# Patient Record
Sex: Male | Born: 1980 | Race: White | Hispanic: Yes | Marital: Married | Smoking: Former smoker
Health system: Southern US, Community
[De-identification: ages and names within clinical notes are randomized; demographics above are authoritative.]

## PROBLEM LIST (undated history)

## (undated) DIAGNOSIS — M25569 Pain in unspecified knee: Secondary | ICD-10-CM

## (undated) DIAGNOSIS — F329 Major depressive disorder, single episode, unspecified: Secondary | ICD-10-CM

## (undated) DIAGNOSIS — G8929 Other chronic pain: Secondary | ICD-10-CM

## (undated) DIAGNOSIS — F1910 Other psychoactive substance abuse, uncomplicated: Secondary | ICD-10-CM

## (undated) DIAGNOSIS — IMO0002 Reserved for concepts with insufficient information to code with codable children: Secondary | ICD-10-CM

## (undated) DIAGNOSIS — F419 Anxiety disorder, unspecified: Secondary | ICD-10-CM

## (undated) DIAGNOSIS — F32A Depression, unspecified: Secondary | ICD-10-CM

## (undated) DIAGNOSIS — G4730 Sleep apnea, unspecified: Secondary | ICD-10-CM

## (undated) HISTORY — DX: Anxiety disorder, unspecified: F41.9

## (undated) HISTORY — PX: CHOLECYSTECTOMY: SHX55

## (undated) HISTORY — PX: OTHER SURGICAL HISTORY: SHX169

## (undated) HISTORY — DX: Other psychoactive substance abuse, uncomplicated: F19.10

## (undated) HISTORY — PX: BACK SURGERY: SHX140

## (undated) HISTORY — DX: Depression, unspecified: F32.A

## (undated) HISTORY — DX: Sleep apnea, unspecified: G47.30

## (undated) HISTORY — PX: GALLBLADDER SURGERY: SHX652

---
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
## 2011-11-12 ENCOUNTER — Ambulatory Visit: Payer: Self-pay | Admitting: Orthopedic Surgery

## 2012-04-13 ENCOUNTER — Emergency Department (HOSPITAL_COMMUNITY)
Admission: EM | Admit: 2012-04-13 | Discharge: 2012-04-13 | Disposition: A | Discharge status: Home / Self Care | Payer: Worker's Compensation | Attending: Emergency Medicine | Admitting: Emergency Medicine

## 2012-04-13 ENCOUNTER — Encounter (HOSPITAL_COMMUNITY): Payer: Self-pay | Admitting: *Deleted

## 2012-04-13 DIAGNOSIS — Z8739 Personal history of other diseases of the musculoskeletal system and connective tissue: Secondary | ICD-10-CM | POA: Insufficient documentation

## 2012-04-13 DIAGNOSIS — M25569 Pain in unspecified knee: Secondary | ICD-10-CM | POA: Insufficient documentation

## 2012-04-13 DIAGNOSIS — R51 Headache: Secondary | ICD-10-CM | POA: Insufficient documentation

## 2012-04-13 DIAGNOSIS — S51809A Unspecified open wound of unspecified forearm, initial encounter: Secondary | ICD-10-CM | POA: Insufficient documentation

## 2012-04-13 DIAGNOSIS — M549 Dorsalgia, unspecified: Secondary | ICD-10-CM | POA: Insufficient documentation

## 2012-04-13 DIAGNOSIS — W503XXA Accidental bite by another person, initial encounter: Secondary | ICD-10-CM

## 2012-04-13 DIAGNOSIS — F17200 Nicotine dependence, unspecified, uncomplicated: Secondary | ICD-10-CM | POA: Insufficient documentation

## 2012-04-13 DIAGNOSIS — G8929 Other chronic pain: Secondary | ICD-10-CM | POA: Insufficient documentation

## 2012-04-13 DIAGNOSIS — S0990XA Unspecified injury of head, initial encounter: Secondary | ICD-10-CM | POA: Insufficient documentation

## 2012-04-13 HISTORY — DX: Pain in unspecified knee: M25.569

## 2012-04-13 HISTORY — DX: Reserved for concepts with insufficient information to code with codable children: IMO0002

## 2012-04-13 HISTORY — DX: Other chronic pain: G89.29

## 2012-04-13 MED ORDER — NAPROXEN 500 MG PO TABS: 500.0000 mg | ORAL_TABLET | Freq: Two times a day (BID) | ORAL | Status: DC

## 2012-04-13 MED ORDER — PREDNISONE 20 MG PO TABS: 40.0000 mg | ORAL_TABLET | Freq: Every day | ORAL | Status: DC

## 2012-04-13 MED ORDER — NAPROXEN 250 MG PO TABS
500.0000 mg | ORAL_TABLET | Freq: Once | ORAL | Status: AC
  Administered 2012-04-13: 500 mg via ORAL
  Filled 2012-04-13: qty 2

## 2012-04-13 MED ORDER — AMOXICILLIN-POT CLAVULANATE 875-125 MG PO TABS: 1.0000 | ORAL_TABLET | Freq: Two times a day (BID) | ORAL | Status: DC

## 2012-04-13 NOTE — ED Notes (Signed)
Pt abrasions cleaning and dressing applied.
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
## 2012-04-13 NOTE — ED Notes (Signed)
Pt having workers comp. Being performed.

## 2013-02-01 ENCOUNTER — Emergency Department: Payer: Self-pay | Admitting: Emergency Medicine

## 2013-02-01 LAB — COMPREHENSIVE METABOLIC PANEL
Albumin: 4.2 g/dL (ref 3.4–5.0)
Alkaline Phosphatase: 84 U/L (ref 50–136)
Anion Gap: 6 — ABNORMAL LOW (ref 7–16)
BUN: 12 mg/dL (ref 7–18)
Bilirubin,Total: 0.4 mg/dL (ref 0.2–1.0)
Co2: 27 mmol/L (ref 21–32)
EGFR (African American): >60
EGFR (Non-African Amer.): >60
Glucose: 119 mg/dL — ABNORMAL HIGH (ref 65–99)
Osmolality: 278 (ref 275–301)
Potassium: 4 mmol/L (ref 3.5–5.1)
Total Protein: 8.8 g/dL — ABNORMAL HIGH (ref 6.4–8.2)

## 2013-02-01 LAB — URINALYSIS, COMPLETE
Bacteria: NONE SEEN
Bilirubin,UR: NEGATIVE
Blood: NEGATIVE
Glucose,UR: NEGATIVE mg/dL (ref 0–75)
Specific Gravity: 1.023 (ref 1.003–1.030)
Squamous Epithelial: NONE SEEN

## 2013-02-01 LAB — CBC WITH DIFFERENTIAL/PLATELET
Basophil #: 0 10*3/uL (ref 0.0–0.1)
Basophil %: 0.4 %
Eosinophil %: 0.6 %
HGB: 15.9 g/dL (ref 13.0–18.0)
Monocyte %: 6.4 %
Neutrophil %: 62.6 %

## 2014-02-21 ENCOUNTER — Emergency Department: Payer: Self-pay | Admitting: Emergency Medicine

## 2014-06-26 ENCOUNTER — Inpatient Hospital Stay: Payer: Self-pay | Admitting: Surgery

## 2014-06-26 LAB — COMPREHENSIVE METABOLIC PANEL
ALBUMIN: 4.1 g/dL (ref 3.4–5.0)
ALK PHOS: 87 U/L
ALT: 107 U/L — AB
AST: 49 U/L — AB (ref 15–37)
Anion Gap: 5 — ABNORMAL LOW (ref 7–16)
BILIRUBIN TOTAL: 0.6 mg/dL (ref 0.2–1.0)
BUN: 14 mg/dL (ref 7–18)
CALCIUM: 9.3 mg/dL (ref 8.5–10.1)
CO2: 27 mmol/L (ref 21–32)
Chloride: 106 mmol/L (ref 98–107)
Creatinine: 0.97 mg/dL (ref 0.60–1.30)
EGFR (African American): >60
EGFR (Non-African Amer.): >60
GLUCOSE: 119 mg/dL — AB (ref 65–99)
Osmolality: 277 (ref 275–301)
POTASSIUM: 4.1 mmol/L (ref 3.5–5.1)
Sodium: 138 mmol/L (ref 136–145)
TOTAL PROTEIN: 8.6 g/dL — AB (ref 6.4–8.2)

## 2014-06-26 LAB — CBC WITH DIFFERENTIAL/PLATELET
Basophil #: 0 10*3/uL (ref 0.0–0.1)
Basophil %: 0.2 %
Eosinophil #: 0 10*3/uL (ref 0.0–0.7)
Eosinophil %: 0.3 %
HCT: 48.3 % (ref 40.0–52.0)
HGB: 16 g/dL (ref 13.0–18.0)
Lymphocyte #: 1.9 10*3/uL (ref 1.0–3.6)
Lymphocyte %: 10.4 %
MCH: 31.2 pg (ref 26.0–34.0)
MCHC: 33.1 g/dL (ref 32.0–36.0)
MCV: 95 fL (ref 80–100)
MONOS PCT: 4.9 %
Monocyte #: 0.9 x10 3/mm (ref 0.2–1.0)
Neutrophil #: 15.1 10*3/uL — ABNORMAL HIGH (ref 1.4–6.5)
Neutrophil %: 84.2 %
Platelet: 285 10*3/uL (ref 150–440)
RBC: 5.11 10*6/uL (ref 4.40–5.90)
RDW: 13.1 % (ref 11.5–14.5)
WBC: 17.9 10*3/uL — AB (ref 3.8–10.6)

## 2014-06-26 LAB — URINALYSIS, COMPLETE
BACTERIA: NONE SEEN
BILIRUBIN, UR: NEGATIVE
BLOOD: NEGATIVE
GLUCOSE, UR: NEGATIVE mg/dL (ref 0–75)
KETONE: NEGATIVE
Leukocyte Esterase: NEGATIVE
Nitrite: NEGATIVE
Ph: 6 (ref 4.5–8.0)
Protein: NEGATIVE
RBC,UR: 1 /HPF (ref 0–5)
SPECIFIC GRAVITY: 1.021 (ref 1.003–1.030)

## 2014-06-26 LAB — LIPASE, BLOOD: LIPASE: 320 U/L (ref 73–393)

## 2014-06-27 LAB — COMPREHENSIVE METABOLIC PANEL
ALBUMIN: 3.3 g/dL — AB (ref 3.4–5.0)
ALK PHOS: 73 U/L
ALT: 101 U/L — AB
ANION GAP: 2 — AB (ref 7–16)
BUN: 8 mg/dL (ref 7–18)
Bilirubin,Total: 1 mg/dL (ref 0.2–1.0)
CREATININE: 0.87 mg/dL (ref 0.60–1.30)
Calcium, Total: 8.3 mg/dL — ABNORMAL LOW (ref 8.5–10.1)
Chloride: 106 mmol/L (ref 98–107)
Co2: 29 mmol/L (ref 21–32)
EGFR (African American): >60
Glucose: 87 mg/dL (ref 65–99)
Osmolality: 272 (ref 275–301)
Potassium: 4.1 mmol/L (ref 3.5–5.1)
SGOT(AST): 52 U/L — ABNORMAL HIGH (ref 15–37)
Sodium: 137 mmol/L (ref 136–145)
Total Protein: 7.2 g/dL (ref 6.4–8.2)

## 2014-06-27 LAB — CBC WITH DIFFERENTIAL/PLATELET
BASOS PCT: 0.4 %
Basophil #: 0 10*3/uL (ref 0.0–0.1)
EOS ABS: 0.2 10*3/uL (ref 0.0–0.7)
EOS PCT: 2.2 %
HCT: 47 % (ref 40.0–52.0)
HGB: 15.8 g/dL (ref 13.0–18.0)
LYMPHS PCT: 37.5 %
Lymphocyte #: 3 10*3/uL (ref 1.0–3.6)
MCH: 32 pg (ref 26.0–34.0)
MCHC: 33.7 g/dL (ref 32.0–36.0)
MCV: 95 fL (ref 80–100)
MONOS PCT: 10 %
Monocyte #: 0.8 x10 3/mm (ref 0.2–1.0)
NEUTROS PCT: 49.9 %
Neutrophil #: 4 10*3/uL (ref 1.4–6.5)
Platelet: 205 10*3/uL (ref 150–440)
RBC: 4.94 10*6/uL (ref 4.40–5.90)
RDW: 12.7 % (ref 11.5–14.5)
WBC: 8 10*3/uL (ref 3.8–10.6)

## 2014-06-27 LAB — PROTIME-INR
INR: 1.1
PROTHROMBIN TIME: 14.3 s (ref 11.5–14.7)

## 2014-06-27 LAB — AMYLASE: AMYLASE: 66 U/L (ref 25–115)

## 2014-06-27 LAB — LIPASE, BLOOD: Lipase: 385 U/L (ref 73–393)

## 2014-06-27 LAB — APTT: Activated PTT: 35.6 secs (ref 23.6–35.9)

## 2014-09-28 NOTE — Consult Note (Signed)
PATIENT NAME:  Nicholas Haney, Nicholas Haney MR#:  [id number] DATE OF BIRTH:  12/07/1980  DATE OF CONSULTATION:  02/01/2013  CONSULTING PHYSICIAN:  Cristal Deerhristopher A. Nathasha Fiorillo, MD  REASON FOR CONSULTATION: Nausea, vomiting and epigastric pain.   HISTORY OF PRESENT ILLNESS: Nicholas Haney is a pleasant 34 year old male with history of back surgery and 3-times-a-week alcohol use who presents with recurrent back pain and one day of epigastric pain which has now resolved. He says that yesterday in the afternoon, his back started hurting again. This often happens and improves with massage; however, late last night, he developed epigastric pain, nausea, vomiting, and he presented to the ED today.   Since he was in the ED, he was given morphine and his pain has since subsided. He is currently without pain. He has no fevers, chills, night sweats, shortness of breath, cough, chest pain. No dysuria or hematuria.   PAST MEDICAL HISTORY: History of back surgery, 2005.   HOME MEDICATIONS: None.   ALLERGIES: None.   SOCIAL HISTORY: Occasional tobacco use, 3-times-a-week up to 6-pack alcohol use.  Denies other drug use. History of a soldier.  FAMILY HISTORY: No diabetes, cancer or heart disease. Mother did have gallbladder removal.   REVIEW OF SYSTEMS: A 12-point review of systems was obtained. Pertinent positives and negatives as above.   PHYSICAL EXAMINATION:  VITAL SIGNS: Temperature 98.2, pulse 78, blood pressure 136/60, respirations 20, 96% on room air.  GENERAL: No acute distress, alert and oriented x 3.  HEAD: Normocephalic, atraumatic.  EYES: No scleral icterus. No conjunctivitis.  FACE: No obvious facial trauma. Normal external nose. Normal external ears.  CHEST: Lungs clear to auscultation, moving air well.  HEART: Regular rate and rhythm. No murmurs, rubs or gallops.  ABDOMEN: Soft, nontender, nondistended.  NEUROLOGIC: Cranial nerves II through XII grossly intact. Sensation intact in all 4   extremities.  EXTREMITIES: Moves all extremities well. Strength 5/5.   DIAGNOSTIC STUDIES: Labs: White blood cell count 8.3. Hemoglobin, hematocrit, and platelets are normal. AST and ALT are 52 and 92. Lipase is 677.   Ultrasound shows mild gallbladder wall thickening and what looks like cholestatic gallbladder neck.   ASSESSMENT AND PLAN: [name] is a pleasant 34 year old with what sounds like pancreatitis, may be due to alcohol, but also with signs concerning for hydrops with an obstructive stone, and I have offered cholecystectomy as early as Friday, and he would like to continue to think about this.   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 He would prefer to follow up with me in clinic and we will discuss at that time.    ____________________________ Si Raiderhristopher A. Macie Baum, MD cal:np D: 02/01/2013 15:04:00 ET T: 02/01/2013 15:56:00 ET JOB#: [id number]  cc: Cristal Deerhristopher A. Marlean Mortell, MD, <Dictator> Jarvis NewcomerHRISTOPHER A Yissel Habermehl MD ELECTRONICALLY SIGNED 02/03/2013 9:25

## 2014-10-01 LAB — SURGICAL PATHOLOGY

## 2014-10-07 NOTE — H&P (Signed)
PATIENT NAME:  Nicholas Haney, Nicholas Haney MR#:  [id number] DATE OF BIRTH:  13-Dec-1980  DATE OF ADMISSION:  06/26/2014  HISTORY OF PRESENT ILLNESS: [name] is a 34 year old Hispanic male who for a number of years has had occasional postprandial epigastric pain and vomiting. This has been occurring about 1 to 2 times a month for the last couple of years. He recently started Zoloft, as a medication for his posttraumatic stress disorder, through the [facility], and since he started that he has noticed that he feels like his food gets stuck in his lower neck, upper chest region and he has smaller, softer bowel movements. He has been under the care of the [facility] for this for a number of years, but this was the first medication that he has agreed to take as according to his wife he has been noncompliant with previous suggestions.   Last night about 8:00, right after eating a meal, he drank lots of water because again he felt like his food was stuck and he then developed epigastric pain. He did not vomit at that time but had some dry heaves and heartburn and denies fever and any jaundice symptoms, but he said he has been feeling chills. The pain was so severe that he was unable to sleep all night and he sought medical attention in the Emergency Department this morning.   PAST MEDICAL HISTORY: Back surgery 2005, knee problems, posttraumatic stress disorder, obesity.   MEDICATIONS: Endocet 5/325 q. 6 hours p.r.n., naproxen 500 mg b.i.d., Valium 5 mg q.i.d. p.r.n., Zoloft, unknown dose.   ALLERGIES: None.   FAMILY HISTORY: There is no family history of diabetes or cancer. The patient's mother did have her gallbladder removed.   REVIEW OF SYSTEMS: A complete 10 system review is negative, particularly for head, eyes, ears, cardiac, respiratory, and neurologic systems. Pertinent positives include the gastrointestinal symptoms as mentioned in the history of present illness and pertinent  gastrointestinal negatives include denial of change in the color of his eyes, skin, urine, and stool. The patient is very quiet about his psychiatric symptoms, but it is clear that there is some concerns psychologically that extend beyond his current physical illness. The patient denies any genitourinary symptoms as well.   SOCIAL HISTORY: The patient is married and has 2 daughters living at home, ages 437 and 723. He works as a Biochemist, clinicaldetention officer. He smokes 3 to 4 cigarettes per day and does not wish to have a nicotine patch while hospitalized. He drinks 6 to 12 beers three to four days per week and does not use any other types of alcohol.   PHYSICAL EXAMINATION: GENERAL: A large muscular young man who is quite stoic and does not appear to be in any distress.  VITAL SIGNS: Height 5 feet 11 inches, weight 275 pounds, BMI 38.4. Temperature 98.1, pulse 82, respirations 18, blood pressure 157/80, oxygen saturation 97% on room air.  HEENT: Pupils equally round and reactive to light. Extraocular movements intact. Sclerae anicteric. Oropharynx clear. Mucous membranes moist. Hearing intact to voice.  NECK: Supple with no thyroid enlargement, jugular venous distention or tracheal deviation.  HEART: Regular rate and rhythm with no murmurs or rubs.  LUNGS: Clear to auscultation with normal respiratory effort bilaterally.  ABDOMEN: Soft, nontender, nondistended, with normal bowel sounds.  EXTREMITIES: No edema with normal capillary refill bilaterally.  NEUROLOGIC: Cranial nerves II through XII, motor and sensation grossly intact.  PSYCHIATRIC: The patient is quite stoic but does answer direct  questions and his wife offers additional information regarding his multiple stressors.   DIAGNOSTIC DATA: White blood cell count 18,000, normal hemoglobin and hematocrit, platelet count and machine differential. Electrolytes normal. Lipase 320. Bilirubin 0.6, alkaline phosphatase 87, SGOT 49, SGPT 107.   Gallbladder  ultrasound reveals a 23 mm gallstone stuck in the neck of his gallbladder, just as it was on previous ultrasound in 2014 when he was seen by my partner and declined follow-up. There is no gallbladder wall thickening and no pericholecystic edema or fluid. The common bile duct measures 4 mm and there is prominent liver echogenicity consistent with hepatic steatosis.   ASSESSMENT: Possible acute cholecystitis but also possible alcoholic hepatitis, possibly exacerbated by concomitant recent Zoloft use. I favor the former and advised the patient to be admitted to the hospital, which he agreed upon.   PLAN: Admit to the hospital, IV fluid hydration, antiemetics, IV analgesics, IV antibiotics, repeat laboratory studies in the morning to include another amylase, and laparoscopic cholecystectomy tomorrow. The patient and his wife understand the 1:200 risk of common bile duct injury and also understand that if that occurs that the implications are serious and will likely require further surgery. The patient and his wife also understand that he is to not strain his abdominal wall for 6 weeks following surgery in order to prevent incisional herniation at the large incision. They also understand the small risk of having to convert his operation from laparoscopic to open. The patient is in complete agreement with the plan and wishes to proceed.  ____________________________ Claude Manges, MD wfm:sb D: 06/26/2014 09:09:32 ET T: 06/26/2014 09:19:00 ET JOB#: [id number]  cc: Claude Manges, MD, <Dictator> Claude Manges MD ELECTRONICALLY SIGNED 06/29/2014 13:53

## 2014-10-07 NOTE — Discharge Summary (Signed)
PATIENT NAME:  Nicholas Haney, Nicholas Haney MR#:  [id number] DATE OF BIRTH:  05-10-1981  DATE OF ADMISSION:  06/26/2014 DATE OF DISCHARGE:  06/28/2014  PRINCIPAL DIAGNOSIS: Acute cholecystitis.   OTHER DIAGNOSES: Posttraumatic stress disorder, obesity.   PRINCIPAL PROCEDURE PERFORMED DURING THIS ADMISSION: Laparoscopic cholecystectomy January 20.   HOSPITAL COURSE: The patient was admitted to the hospital, underwent the above-mentioned procedure for the above-mentioned diagnosis and improved dramatically and was discharged home the following day. He was asked to make an appointment to see me and to call the office in the interim for any problems.    ____________________________ Claude MangesWilliam F. Bailie Christenbury, MD wfm:TT D: 07/11/2014 19:35:09 ET T: 07/11/2014 21:11:51 ET JOB#: [id number]  cc: Claude MangesWilliam F. Tyde Lamison, MD, <Dictator> Claude MangesWILLIAM F Brydon Spahr MD ELECTRONICALLY SIGNED 07/12/2014 20:55

## 2014-10-07 NOTE — Op Note (Signed)
PATIENT NAME:  Nicholas LenzLMA PEREZ, Ronin MR#:  [id number] DATE OF BIRTH:  [birth year]  DATE OF PROCEDURE:  06/27/2014  OPERATION PERFORMED: Laparoscopic cholecystectomy.   PREOPERATIVE DIAGNOSIS:  Acute cholecystitis.      POSTOPERATIVE DIAGNOSIS:  Acute cholecystitis.  SURGEON: Claude MangesWilliam F Chrissa Meetze, M.D.   ANESTHESIA: General.   PROCEDURE IN DETAIL: The patient was placed supine on the Operating Room table and prepped and draped in the usual sterile fashion. A 15 mmHg CO2 pneumoperitoneum was created via a Veress needle on the infraumbilical umbilical position and this was replaced with a 5 mm trocar and a 30 degrees angled laparoscope. Remaining trocars were placed under direct visualization. The fundus of the gallbladder was retracted superiorly and ventrally, and adhesions to the infundibulum of the gallbladder were taken down bluntly with the aid of the electrocautery, and the infundibulum was retracted laterally to open up the triangle of Calot. There was a very large Cloquet's node that was present within the triangle and this was dissected off towards the porta hepatis. The cystic artery was identified and doubly clipped and divided. The cystic duct was identified, doubly clipped and divided and the gallbladder was removed from the liver bed with the electrocautery. It was placed in an Endo Catch bag and extracted from the abdomen via the epigastric port. This port site fascia required significant enlargement due to the size of the stone.  It was then closed with running 0 Vicryl suture that was placed with the laparoscopic puncture closure device. The right upper quadrant was irrigated with warm normal saline. This was suctioned clear. Hemostasis was excellent and the clips were secure and there was no evidence of bile staining. Therefore, the peritoneum was desufflated and decannulated and all four skin sites were closed with subcuticular 5-0 Monocryl and suture strips. The patient's gallbladder was  opened as he requested to have his gallstone and he just had a single large gallstone that was 33 x 15 mm and ovoid in shape.     ____________________________ Claude MangesWilliam F. Keltin Baird, MD wfm:at D: 06/27/2014 15:04:32 ET T: 06/27/2014 15:13:06 ET JOB#: [id number]  cc: Claude MangesWilliam F. Emilee Market, MD, <Dictator> Claude MangesWILLIAM F Andree Golphin MD ELECTRONICALLY SIGNED 06/29/2014 13:53

## 2016-01-13 ENCOUNTER — Encounter (HOSPITAL_COMMUNITY): Payer: Self-pay

## 2016-01-13 DIAGNOSIS — F17210 Nicotine dependence, cigarettes, uncomplicated: Secondary | ICD-10-CM | POA: Insufficient documentation

## 2016-01-13 DIAGNOSIS — M545 Low back pain: Secondary | ICD-10-CM | POA: Diagnosis present

## 2016-01-13 DIAGNOSIS — M5441 Lumbago with sciatica, right side: Secondary | ICD-10-CM | POA: Insufficient documentation

## 2016-01-13 NOTE — ED Triage Notes (Signed)
Pt states sent here for high BP. Pt states has HA, some light headedness.

## 2016-01-14 ENCOUNTER — Encounter (HOSPITAL_COMMUNITY): Payer: Self-pay | Admitting: Radiology

## 2016-01-14 ENCOUNTER — Emergency Department (HOSPITAL_COMMUNITY): Payer: Worker's Compensation

## 2016-01-14 ENCOUNTER — Emergency Department (HOSPITAL_COMMUNITY)
Admission: EM | Admit: 2016-01-14 | Discharge: 2016-01-14 | Disposition: A | Discharge status: Home / Self Care | Payer: Worker's Compensation | Attending: Emergency Medicine | Admitting: Emergency Medicine

## 2016-01-14 DIAGNOSIS — M5431 Sciatica, right side: Secondary | ICD-10-CM

## 2016-01-14 MED ORDER — KETOROLAC TROMETHAMINE 60 MG/2ML IM SOLN
60.0000 mg | Freq: Once | INTRAMUSCULAR | Status: AC
  Administered 2016-01-14: 60 mg via INTRAMUSCULAR
  Filled 2016-01-14: qty 2

## 2016-01-14 MED ORDER — HYDROCODONE-ACETAMINOPHEN 5-325 MG PO TABS
2.0000 | ORAL_TABLET | Freq: Once | ORAL | Status: AC
  Administered 2016-01-14: 2 via ORAL
  Filled 2016-01-14: qty 2

## 2016-01-14 MED ORDER — OXYCODONE HCL 5 MG PO TABS: 5.0000 mg | ORAL_TABLET | Freq: Two times a day (BID) | ORAL | 0 refills | Status: DC | PRN
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
## 2016-01-14 NOTE — ED Notes (Signed)
Patient transported to CT 

## 2018-12-07 DIAGNOSIS — F1020 Alcohol dependence, uncomplicated: Secondary | ICD-10-CM | POA: Insufficient documentation

## 2018-12-07 DIAGNOSIS — F1091 Alcohol use, unspecified, in remission: Secondary | ICD-10-CM | POA: Insufficient documentation

## 2018-12-15 ENCOUNTER — Other Ambulatory Visit: Payer: Self-pay | Admitting: *Deleted

## 2018-12-15 DIAGNOSIS — Z20822 Contact with and (suspected) exposure to covid-19: Secondary | ICD-10-CM

## 2018-12-20 LAB — NOVEL CORONAVIRUS, NAA: SARS-CoV-2, NAA: DETECTED — AB

## 2019-01-23 ENCOUNTER — Other Ambulatory Visit: Payer: Self-pay

## 2019-01-23 ENCOUNTER — Ambulatory Visit (INDEPENDENT_AMBULATORY_CARE_PROVIDER_SITE_OTHER): Payer: 59 | Admitting: Family Medicine

## 2019-01-23 ENCOUNTER — Encounter: Payer: Self-pay | Admitting: Family Medicine

## 2019-01-23 DIAGNOSIS — F332 Major depressive disorder, recurrent severe without psychotic features: Secondary | ICD-10-CM

## 2019-01-23 DIAGNOSIS — F419 Anxiety disorder, unspecified: Secondary | ICD-10-CM | POA: Diagnosis not present

## 2019-01-23 DIAGNOSIS — F329 Major depressive disorder, single episode, unspecified: Secondary | ICD-10-CM | POA: Insufficient documentation

## 2019-01-23 DIAGNOSIS — Z8616 Personal history of COVID-19: Secondary | ICD-10-CM

## 2019-01-23 DIAGNOSIS — Z720 Tobacco use: Secondary | ICD-10-CM

## 2019-01-23 DIAGNOSIS — F32A Depression, unspecified: Secondary | ICD-10-CM | POA: Insufficient documentation

## 2019-01-23 DIAGNOSIS — Z87891 Personal history of nicotine dependence: Secondary | ICD-10-CM | POA: Insufficient documentation

## 2019-01-23 DIAGNOSIS — F4310 Post-traumatic stress disorder, unspecified: Secondary | ICD-10-CM | POA: Diagnosis not present

## 2019-01-23 DIAGNOSIS — Z8619 Personal history of other infectious and parasitic diseases: Secondary | ICD-10-CM

## 2019-01-23 DIAGNOSIS — F1020 Alcohol dependence, uncomplicated: Secondary | ICD-10-CM | POA: Diagnosis not present

## 2019-01-23 DIAGNOSIS — Z7689 Persons encountering health services in other specified circumstances: Secondary | ICD-10-CM

## 2019-01-23 MED ORDER — VARENICLINE TARTRATE 1 MG PO TABS: ORAL_TABLET | ORAL | 1 refills | Status: DC

## 2019-01-23 MED ORDER — BUPROPION HCL ER (XL) 150 MG PO TB24: ORAL_TABLET | ORAL | 1 refills | Status: DC

## 2019-01-23 MED ORDER — NALTREXONE HCL 50 MG PO TABS: ORAL_TABLET | ORAL | 1 refills | Status: DC

## 2019-01-23 NOTE — Progress Notes (Signed)
Name: Nicholas Haney   MRN: [id number]    DOB: 07-30-1980   Date:01/23/2019       Progress Note  Subjective  Chief Complaint  Chief Complaint  Patient presents with  . Establish Care  . Medication Refill    I connected with  Nicholas Haney  on 01/23/19 at 10:00 AM EDT by a video enabled telemedicine application and verified that I am speaking with the correct person using two identifiers.  I discussed the limitations of evaluation and management by telemedicine and the availability of in person appointments. The patient expressed understanding and agreed to proceed. Staff also discussed with the patient that there may be a patient responsible charge related to this service. 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  COVID-19 +: Was diagnosed on 12/15/2018 after traveling to [place] for 1.5 months. He reports feeling a lot better; only ever had fever for 1 day at 102F, had some fatigue, body aches, and mild dyspnea with exertion - all of which has gradually resolved.  He has since returned to work.   Depression, PTSD and Alcohol abuse: He was in [place] for rehab back in May 2020 through December 14 2018 for his substance abuse, depression, anxiety, and PTSD. He served in [employer] and suffers form PTSD.  He is currently taking Chantix, naltrexone, Wellbutrin.  Has had a few relapses in the past when he stopped his medication and went back to drinking alcohol.  He does not have follow up or support set up at this time.   Patient Active Problem List   Diagnosis Date Noted  . Depression   . Alcoholism ([place]) 12/07/2018    Past Surgical History:  Procedure Laterality Date  . BACK SURGERY    . GALLBLADDER SURGERY    . herniated disc      No family history on  file.  Social History   Socioeconomic History  . Marital status: Married    Spouse name: Nicholas Haney  . Number of children: 2  . Years of education: Not on file  . Highest education level: Not on file  Occupational History  . Not on file  Social Needs  . Financial resource strain: Not hard at all  . Food insecurity    Worry: Never true    Inability: Never true  . Transportation needs    Medical: No    Non-medical: No  Tobacco Use  . Smoking status: Former Smoker    Packs/day: 0.15    Types: Cigarettes    Quit date: 10/31/2018    Years since quitting: 0.2  . Smokeless tobacco: Never Used  Substance and Sexual Activity  . Alcohol use: Not Currently    Comment: stopped May 25  . Drug use: No  . Sexual activity: Not on file  Lifestyle  . Physical activity    Days per week: 5 days    Minutes per session: 90 min  . Stress: Not at all  Relationships  . Social connections    Talks on phone: More than three times a week    Gets together: Never    Attends religious service: Never    Active member of club or organization: No    Attends meetings of clubs or organizations: Never    Relationship status:  Married  . Intimate partner violence    Fear of current or ex partner: No    Emotionally abused: No    Physically abused: No    Forced sexual activity: No  Other Topics Concern  . Not on file  Social History Narrative  . Not on file     Current Outpatient Medications:  .  buPROPion (WELLBUTRIN XL) 150 MG 24 hr tablet, bupropion HCl XL 150 mg 24 hr tablet, extended release, Disp: , Rfl:  .  naltrexone (DEPADE) 50 MG tablet, naltrexone hcl  50 mg tabs, Disp: , Rfl:  .  varenicline (CHANTIX) 1 MG tablet, chantix  1 mg tabs, Disp: , Rfl:  .  amoxicillin-clavulanate (AUGMENTIN) 875-125 MG per tablet, Take 1 tablet by mouth every 12 (twelve) hours., Disp: 14 tablet, Rfl: 0 .  naproxen (NAPROSYN) 500 MG tablet, Take 1 tablet (500 mg total) by mouth 2 (two) times daily with a meal.,  Disp: 30 tablet, Rfl: 0 .  oxyCODONE (ROXICODONE) 5 MG immediate release tablet, Take 1 tablet (5 mg total) by mouth 2 (two) times daily as needed for severe pain., Disp: 10 tablet, Rfl: 0 .  predniSONE (DELTASONE) 20 MG tablet, Take 2 tablets (40 mg total) by mouth daily., Disp: 10 tablet, Rfl: 0  No Known Allergies  I personally reviewed active problem list, medication list, allergies, family history, social history, health maintenance with the patient/caregiver today.   ROS  Constitutional: Negative for fever or weight change.  Respiratory: Negative for cough and shortness of breath.   Cardiovascular: Negative for chest pain or palpitations.  Gastrointestinal: Negative for abdominal pain, no bowel changes.  Musculoskeletal: Negative for gait problem or joint swelling.  Skin: Negative for rash.  Neurological: Negative for dizziness or headache.  No other specific complaints in a complete review of systems (except as listed in HPI above).   Objective  Virtual encounter, vitals not obtained.  There is no height or weight on file to calculate BMI.  Physical Exam  Constitutional: Patient appears well-developed and well-nourished. No distress.  HENT: Head: Normocephalic and atraumatic.  Neck: Normal range of motion. Pulmonary/Chest: Effort normal. No respiratory distress. Speaking in complete sentences Neurological: Pt is alert and oriented to person, place, and time. Coordination, speech are normal.  Psychiatric: Patient has a normal mood and affect. behavior is normal. Judgment and thought content normal.  No results found for this or any previous visit (from the past 72 hour(s)).  PHQ2/9: Depression screen PHQ 2/9 01/23/2019  Decreased Interest 3  Down, Depressed, Hopeless 3  PHQ - 2 Score 6  Altered sleeping 2  Tired, decreased energy 2  Change in appetite 0  Feeling bad or failure about yourself  3  Trouble concentrating 3  Moving slowly or fidgety/restless 1   Suicidal thoughts 1  PHQ-9 Score 18  Difficult doing work/chores Very difficult   PHQ-2/9 Result is positive.  He denies SI/HI in conversation, though has had passive thoughts in the past.   Fall Risk: Fall Risk  01/23/2019  Falls in the past year? 0  Number falls in past yr: 0  Injury with Fall? 0  Follow up Falls evaluation completed    Assessment & Plan  1. Anxiety - buPROPion (WELLBUTRIN XL) 150 MG 24 hr tablet; bupropion HCl XL 150 mg 24 hr tablet, extended release  Dispense: 90 tablet; Refill: 1 - Ambulatory referral to Psychiatry  2. PTSD (post-traumatic stress disorder) - buPROPion (WELLBUTRIN XL) 150 MG 24 hr tablet; bupropion  HCl XL 150 mg 24 hr tablet, extended release  Dispense: 90 tablet; Refill: 1 - Ambulatory referral to Psychiatry  3. Severe episode of recurrent major depressive disorder, without psychotic features (HCC) - buPROPion (WELLBUTRIN XL) 150 MG 24 hr tablet; bupropion HCl XL 150 mg 24 hr tablet, extended release  Dispense: 90 tablet; Refill: 1 - Ambulatory referral to Psychiatry  4. Alcoholism (HCC) - buPROPion (WELLBUTRIN XL) 150 MG 24 hr tablet; bupropion HCl XL 150 mg 24 hr tablet, extended release  Dispense: 90 tablet; Refill: 1 - Ambulatory referral to Psychiatry - varenicline (CHANTIX) 1 MG tablet; chantix  1 mg tabs  Dispense: 90 tablet; Refill: 1 - naltrexone (DEPADE) 50 MG tablet; Take 1 tablet once daily.  Dispense: 30 tablet; Refill: 1  5. Tobacco abuse - buPROPion (WELLBUTRIN XL) 150 MG 24 hr tablet; bupropion HCl XL 150 mg 24 hr tablet, extended release  Dispense: 90 tablet; Refill: 1 - Ambulatory referral to Psychiatry - varenicline (CHANTIX) 1 MG tablet; chantix  1 mg tabs  Dispense: 90 tablet; Refill: 1 - naltrexone (DEPADE) 50 MG tablet; Take 1 tablet once daily.  Dispense: 30 tablet; Refill: 1  6. History of 2019 novel coronavirus disease (COVID-19) - Doing well, symptoms have resolved, back to work without residual symptoms or  concerns.  7. Encounter to establish care  Urgent referral to psychiatry is placed for this patient; did refill medications to allow him to get through to an appointment with psychiatry as this often takes several weeks to months currently.  I discussed the assessment and treatment plan with the patient. The patient was provided an opportunity to ask questions and all were answered. The patient agreed with the plan and demonstrated an understanding of the instructions.  The patient was advised to call back or seek an in-person evaluation if the symptoms worsen or if the condition fails to improve as anticipated.  I provided 21 minutes of non-face-to-face time during this encounter.

## 2019-01-24 ENCOUNTER — Telehealth: Payer: Self-pay | Admitting: Emergency Medicine

## 2019-01-24 ENCOUNTER — Encounter: Payer: Self-pay | Admitting: Family Medicine

## 2019-01-24 DIAGNOSIS — F332 Major depressive disorder, recurrent severe without psychotic features: Secondary | ICD-10-CM

## 2019-01-24 DIAGNOSIS — Z720 Tobacco use: Secondary | ICD-10-CM

## 2019-01-24 DIAGNOSIS — F1020 Alcohol dependence, uncomplicated: Secondary | ICD-10-CM

## 2019-01-24 DIAGNOSIS — F4310 Post-traumatic stress disorder, unspecified: Secondary | ICD-10-CM

## 2019-01-24 DIAGNOSIS — F419 Anxiety disorder, unspecified: Secondary | ICD-10-CM

## 2019-01-24 MED ORDER — BUPROPION HCL ER (XL) 150 MG PO TB24: ORAL_TABLET | ORAL | 1 refills | Status: DC

## 2019-01-24 NOTE — Telephone Encounter (Signed)
Prescription needs directions.

## 2019-01-30 ENCOUNTER — Telehealth: Payer: Self-pay | Admitting: Family Medicine

## 2019-01-30 DIAGNOSIS — Z720 Tobacco use: Secondary | ICD-10-CM

## 2019-01-30 DIAGNOSIS — F1020 Alcohol dependence, uncomplicated: Secondary | ICD-10-CM

## 2019-01-30 MED ORDER — VARENICLINE TARTRATE 1 MG PO TABS: ORAL_TABLET | ORAL | 1 refills | Status: DC

## 2019-01-30 NOTE — Telephone Encounter (Signed)
Pt called in. He was advised by pharmacy that instructions is needed for varenicline (CHANTIX) 1 MG tablet  In order for Rx to be filled.     Please call pharmacy to assist.

## 2019-01-30 NOTE — Telephone Encounter (Signed)
Please send script again with directions

## 2019-02-01 ENCOUNTER — Telehealth (HOSPITAL_COMMUNITY): Payer: Self-pay | Admitting: Psychology

## 2019-02-06 ENCOUNTER — Telehealth (HOSPITAL_COMMUNITY): Payer: Self-pay | Admitting: Psychology

## 2019-03-24 ENCOUNTER — Encounter: Payer: 59 | Admitting: Family Medicine

## 2019-03-27 ENCOUNTER — Encounter: Payer: 59 | Admitting: Family Medicine

## 2019-04-24 ENCOUNTER — Telehealth: Payer: Self-pay

## 2019-04-24 NOTE — Telephone Encounter (Signed)
Copied from [place] [id number]. Topic: General - Inquiry >> Apr 21, 2019  3:38 PM Richardo Priest, [place] wrote: Reason for CRM: [facility]Pulliam[facility] called in stating they are attempting to re-establish care for patient with Dr.Joseph Vanga. They are requesting records, office notes, and current medication list from most recent visit, 01/23/2019, be faxed over to their office. Fax number is [phone], attention: Dr.Joseph Vanga. If call back needed number is [phone] ext. [phone]. Please advise.   Are we allowed to send the requested information?

## 2019-04-24 NOTE — Telephone Encounter (Signed)
Request medical records were faxed electronically to [facility] attention Dr. Elenore Paddy and Ms. Pulliam as requested on 04/24/2019 @ 1:21pm.   [id number]

## 2020-09-23 ENCOUNTER — Other Ambulatory Visit: Payer: Self-pay

## 2020-09-23 ENCOUNTER — Ambulatory Visit
Admission: RE | Admit: 2020-09-23 | Discharge: 2020-09-23 | Disposition: A | Discharge status: Home / Self Care | Payer: 59 | Source: Ambulatory Visit | Attending: Emergency Medicine | Admitting: Emergency Medicine

## 2020-09-23 VITALS — BP 118/80 | HR 79 | Temp 98.3°F | Resp 16 | Wt 270.0 lb

## 2020-09-23 DIAGNOSIS — J069 Acute upper respiratory infection, unspecified: Secondary | ICD-10-CM | POA: Diagnosis not present

## 2020-09-23 MED ORDER — BENZONATATE 100 MG PO CAPS: 100.0000 mg | ORAL_CAPSULE | Freq: Three times a day (TID) | ORAL | 0 refills | Status: DC | PRN

## 2020-09-23 NOTE — Discharge Instructions (Signed)
Take the [facility] as needed for cough.  Take ibuprofen as needed for fever or discomfort.  Take plain over-the-counter Mucinex as needed for congestion.    Follow up with your primary care provider if your symptoms are not improving.

## 2020-09-23 NOTE — ED Provider Notes (Signed)
Nicholas Haney    CSN: [id number] Arrival date & time: 09/23/20  0856      History   Chief Complaint Chief Complaint  Patient presents with  . Headache  . Nasal Congestion  . Appointment    0900    HPI Nicholas Haney is a 40 y.o. male.   Patient presents with 1 week history of chills, headache, nasal congestion, nonproductive cough.  He denies fever, rash, shortness of breath, vomiting, diarrhea, or other symptoms.  Treatment attempted at home with Tylenol and allergy medication.  His medical history includes chronic knee pain, herniated disc, history of smoking, alcoholism, depression, anxiety, PTSD.  The history is provided by the patient and medical records.    Past Medical History:  Diagnosis Date  . Anxiety   . Chronic knee pain   . Depression   . Herniated disc     Patient Active Problem List   Diagnosis Date Noted  . PTSD (post-traumatic stress disorder) 01/23/2019  . Tobacco abuse 01/23/2019  . History of 2019 novel coronavirus disease (COVID-19) 01/23/2019  . Depression   . Anxiety   . Alcoholism (HCC) 12/07/2018    Past Surgical History:  Procedure Laterality Date  . BACK SURGERY    . GALLBLADDER SURGERY    . herniated disc         Home Medications    Prior to Admission medications   Medication Sig Start Date End Date Taking? Authorizing Provider  benzonatate (TESSALON) 100 MG capsule Take 1 capsule (100 mg total) by mouth 3 (three) times daily as needed for cough. 09/23/20  Yes Mickie Bail, NP  buPROPion (WELLBUTRIN XL) 150 MG 24 hr tablet Take 1 tablet once daily. 01/24/19   Doren Custard, FNP  naltrexone (DEPADE) 50 MG tablet Take 1 tablet once daily. 01/23/19   Doren Custard, FNP  naproxen (NAPROSYN) 500 MG tablet Take 1 tablet (500 mg total) by mouth 2 (two) times daily with a meal. 04/13/12   Eber Hong, MD  varenicline (CHANTIX) 1 MG tablet Take 1 tablet PO once daily 01/30/19   Doren Custard, FNP    Family  History History reviewed. No pertinent family history.  Social History Social History   Tobacco Use  . Smoking status: Former Smoker    Packs/day: 0.15    Types: Cigarettes    Quit date: 10/31/2018    Years since quitting: 1.8  . Smokeless tobacco: Never Used  Vaping Use  . Vaping Use: Never used  Substance Use Topics  . Alcohol use: Not Currently    Comment: stopped May 25  . Drug use: No     Allergies   Patient has no known allergies.   Review of Systems Review of Systems  Constitutional: Positive for chills. Negative for fever.  HENT: Positive for congestion. Negative for ear pain and sore throat.   Eyes: Negative for pain and visual disturbance.  Respiratory: Positive for cough. Negative for shortness of breath.   Cardiovascular: Negative for chest pain and palpitations.  Gastrointestinal: Negative for abdominal pain and vomiting.  Genitourinary: Negative for dysuria and hematuria.  Musculoskeletal: Negative for arthralgias and back pain.  Skin: Negative for color change and rash.  Neurological: Positive for headaches. Negative for dizziness, syncope, weakness and numbness.  All other systems reviewed and are negative.    Physical Exam Triage Vital Signs ED Triage Vitals  Enc Vitals Group     BP      Pulse  Resp      Temp      Temp src      SpO2      Weight      Height      Head Circumference      Peak Flow      Pain Score      Pain Loc      Pain Edu?      Excl. in GC?    No data found.  Updated Vital Signs BP 118/80 (BP Location: Left Arm)   Pulse 79   Temp 98.3 F (36.8 C) (Oral)   Resp 16   Wt 270 lb (122.5 kg)   SpO2 96%   BMI 37.66 kg/m   Visual Acuity Right Eye Distance:   Left Eye Distance:   Bilateral Distance:    Right Eye Near:   Left Eye Near:    Bilateral Near:     Physical Exam Vitals and nursing note reviewed.  Constitutional:      General: He is not in acute distress.    Appearance: He is well-developed. He  is not ill-appearing.  HENT:     Head: Normocephalic and atraumatic.     Right Ear: Tympanic membrane normal.     Left Ear: Tympanic membrane normal.     Nose: Nose normal.     Mouth/Throat:     Mouth: Mucous membranes are moist.     Pharynx: Oropharynx is clear.  Eyes:     Conjunctiva/sclera: Conjunctivae normal.  Cardiovascular:     Rate and Rhythm: Normal rate and regular rhythm.     Heart sounds: Normal heart sounds.  Pulmonary:     Effort: Pulmonary effort is normal. No respiratory distress.     Breath sounds: Normal breath sounds.  Abdominal:     Palpations: Abdomen is soft.     Tenderness: There is no abdominal tenderness. There is no guarding or rebound.  Musculoskeletal:     Cervical back: Neck supple.  Skin:    General: Skin is warm and dry.     Findings: No rash.  Neurological:     General: No focal deficit present.     Mental Status: He is alert and oriented to person, place, and time.     Gait: Gait normal.  Psychiatric:        Mood and Affect: Mood normal.        Behavior: Behavior normal.      UC Treatments / Results  Labs (all labs ordered are listed, but only abnormal results are displayed) Labs Reviewed - No data to display  EKG   Radiology No results found.  Procedures Procedures (including critical care time)  Medications Ordered in UC Medications - No data to display  Initial Impression / Assessment and Plan / UC Course  I have reviewed the triage vital signs and the nursing notes.  Pertinent labs & imaging results that were available during my care of the patient were reviewed by me and considered in my medical decision making (see chart for details).   Viral URI with cough.  Patient declines COVID test today.  Treating cough with Tessalon Perles.  Additional symptomatic treatment with ibuprofen and OTC Mucinex.  Work note provided per patient request.  Instructed him to follow-up with his PCP if his symptoms are not improving.  He  agrees to plan of care.   Final Clinical Impressions(s) / UC Diagnoses   Final diagnoses:  Viral URI with cough  Discharge Instructions     Take the [name] as needed for cough.  Take ibuprofen as needed for fever or discomfort.  Take plain over-the-counter Mucinex as needed for congestion.    Follow up with your primary care provider if your symptoms are not improving.        ED Prescriptions    Medication Sig Dispense Auth. Provider   benzonatate (TESSALON) 100 MG capsule Take 1 capsule (100 mg total) by mouth 3 (three) times daily as needed for cough. 21 capsule Mickie Bail, NP     PDMP not reviewed this encounter.   Mickie Bail, NP 09/23/20 [phone]

## 2020-09-23 NOTE — ED Triage Notes (Signed)
Patient presents to Urgent Care with complaints of cough, chills, headache, nasal congestion x 1 week. Treating symptoms with tylenol and otc allergy meds.   Denies fever, n/v, or diarrhea.

## 2021-04-29 ENCOUNTER — Ambulatory Visit: Payer: 59

## 2021-05-06 ENCOUNTER — Ambulatory Visit (INDEPENDENT_AMBULATORY_CARE_PROVIDER_SITE_OTHER): Payer: 59 | Admitting: Family Medicine

## 2021-05-06 ENCOUNTER — Encounter: Payer: Self-pay | Admitting: Family Medicine

## 2021-05-06 VITALS — BP 122/82 | HR 90 | Temp 98.7°F | Resp 18 | Ht 69.5 in | Wt 281.3 lb

## 2021-05-06 DIAGNOSIS — L0591 Pilonidal cyst without abscess: Secondary | ICD-10-CM | POA: Insufficient documentation

## 2021-05-06 DIAGNOSIS — R0683 Snoring: Secondary | ICD-10-CM

## 2021-05-06 DIAGNOSIS — Z87891 Personal history of nicotine dependence: Secondary | ICD-10-CM

## 2021-05-06 DIAGNOSIS — F1091 Alcohol use, unspecified, in remission: Secondary | ICD-10-CM

## 2021-05-06 DIAGNOSIS — K760 Fatty (change of) liver, not elsewhere classified: Secondary | ICD-10-CM | POA: Diagnosis not present

## 2021-05-06 DIAGNOSIS — F1220 Cannabis dependence, uncomplicated: Secondary | ICD-10-CM | POA: Insufficient documentation

## 2021-05-06 DIAGNOSIS — Z0000 Encounter for general adult medical examination without abnormal findings: Secondary | ICD-10-CM

## 2021-05-06 DIAGNOSIS — G8929 Other chronic pain: Secondary | ICD-10-CM | POA: Insufficient documentation

## 2021-05-06 DIAGNOSIS — Z6841 Body Mass Index (BMI) 40.0 and over, adult: Secondary | ICD-10-CM | POA: Diagnosis not present

## 2021-05-06 DIAGNOSIS — F4310 Post-traumatic stress disorder, unspecified: Secondary | ICD-10-CM

## 2021-05-06 NOTE — Assessment & Plan Note (Signed)
Currently doing well, symptoms manageable without treatment. Continue to monitor.

## 2021-05-06 NOTE — Patient Instructions (Addendum)
It was great to see you!  Our plans for today:  - We will send the paperwork to your surgeon.  - We are checking some labs today, we will release these results to your MyChart. - Keep working on losing weight.  - If you don't hear about the sleep study in the next week, let us know.   Take care and seek immediate care sooner if you develop any concerns.   Dr. Ky Barban  Here is an example of what a healthy plate looks like:    ? Make half your plate fruits and vegetables.     ? Focus on whole fruits.     ? Vary your veggies.  ? Make half your grains whole grains. -     ? Look for the word "whole" at the beginning of the ingredients list    ? Some whole-grain ingredients include whole oats, whole-wheat flour,        whole-grain corn, whole-grain brown rice, and whole rye.  ? Move to low-fat and fat-free milk or yogurt.  ? Vary your protein routine. - Meat, fish, poultry (chicken, [place]), eggs, beans (kidney, pinto), dairy.  ? Drink and eat less sodium, saturated fat, and added sugars.  Look for opportunities to move your body throughout your day:  Never lie down when you can sit; never sit when you can stand; never stand when you can pace.  Moving your body throughout the day is just as important as the 30 or 60 minutes of exercise at the gym!  Get social Get active with your friends instead of going out to eat. Go for a hike, walk around the mall, or play an exercise-themed video game.   Move more at work Fit more activity into the workday. Stand during phone calls, use a printer farther from your desk, and get up to stretch each hour.    Do something new Develop a new skill to kick-start your motivation. Sign up for a class to learn how to [employer], surf, do tai chi, or play a sport.    Keep cool in the pool Don't like to sweat? Hit the local community pool for a swim, water polo, or water aerobics class to stay cool while exercising.    Stay on track Use a  fitness tracker (FITBIT, Fitness Pal mobile app) to track your activity and provide motivation to reach your goals.

## 2021-05-06 NOTE — Assessment & Plan Note (Signed)
In remission  Continue to monitor

## 2021-05-06 NOTE — Assessment & Plan Note (Signed)
Contributing to fatty liver, likely OSA, obtaining sleep study. Recommend consistent efforts with diet and exercise. Forms for bariatric surgery reviewed and filled out today, will fax to surgeon office.

## 2021-05-06 NOTE — Progress Notes (Signed)
BP 122/82   Pulse 90   Temp 98.7 F (37.1 C)   Resp 18   Ht 5' 9.5" (1.765 m)   Wt 281 lb 4.8 oz (127.6 kg)   SpO2 96%   BMI 40.95 kg/m    Subjective:    Patient ID: Nicholas Haney, male    DOB: 1980/09/02, 40 y.o.   MRN: [id number]  HPI: Nicholas Haney is a 40 y.o. male presenting on 05/06/2021 for comprehensive medical examination. Current medical complaints include: needs form filled out for weight loss surgery  OBESITY - undergoing evaluation for bariatric surgery. Hasn't seen surgeon yet. No appt yet. Has done online webinar and is interested.  - Meds: none - Previously on none - Complications of obesity: h/o pilonidal cyst, fatty liver on [place] - Peak weight: 281lb, BMI 40 - Weight loss to date: 0 lb - previous efforts include portion control, cutting carbs, fatty foods and sugary beverages and cardio exercise, got down to 240lb, couldn't maintain.  - Diet: 1 meal per day, chicken, rice, salad in the evening. Snacks throughout the day.  - Exercise: none currently.  - sleeps ok at night. Does snore. Wife witnessed apneic episodes. Never had sleep study. - no known FH of DM2, HLD, HTN.   Tobacco use - stopped 2 years ago. 20 years, 0.5ppd.   Alcoholism - stopped drinking 2 years ago. Previously drank 5-6 40oz beers per day. Has relapsed a few times. Last drink September. Never went through withdrawal.  PTSD - previously on wellbutrin. Previously followed by Psych at the [place]. Not on anything currently. Some anxiety, nervousness. Feels symptoms are well controlled. No nightmares. No SI/HI.  He currently lives with: wife, 2 daughters. Currently building a house and living with relatives.  Depression Screen done today and results listed below:  Depression screen [facility] 2/9 05/06/2021 01/23/2019  Decreased Interest 1 3  Down, Depressed, Hopeless 0 3  PHQ - 2 Score 1 6  Altered sleeping 0 2  Tired, decreased energy 0 2  Change in appetite 0 0  Feeling bad or failure  about yourself  0 3  Trouble concentrating 0 3  Moving slowly or fidgety/restless 0 1  Suicidal thoughts 0 1  PHQ-9 Score 1 18  Difficult doing work/chores Not difficult at all Very difficult    The patient does not have a history of falls. I did not complete a risk assessment for falls. A plan of care for falls was not documented.   Past Medical History:  Past Medical History:  Diagnosis Date   Anxiety    Chronic knee pain    Depression    Herniated disc     Surgical History:  Past Surgical History:  Procedure Laterality Date   BACK SURGERY     GALLBLADDER SURGERY     herniated disc      Medications:  No current outpatient medications on file prior to visit.   No current facility-administered medications on file prior to visit.    Allergies:  No Known Allergies  Social History:  Social History   Socioeconomic History   Marital status: Married    Spouse name: chandra   Number of children: 2   Years of education: Not on file   Highest education level: Not on file  Occupational History   Not on file  Tobacco Use   Smoking status: Former    Packs/day: 0.15    Types: Cigarettes    Quit date: 10/31/2018  Years since quitting: 2.5   Smokeless tobacco: Never  Vaping Use   Vaping Use: Never used  Substance and Sexual Activity   Alcohol use: Not Currently    Comment: stopped May 25   Drug use: No   Sexual activity: Not on file  Other Topics Concern   Not on file  Social History Narrative   Not on file   Social Determinants of Health   Financial Resource Strain: Not on file  Food Insecurity: Not on file  Transportation Needs: Not on file  Physical Activity: Not on file  Stress: Not on file  Social Connections: Not on file  Intimate Partner Violence: Not on file   Social History   Tobacco Use  Smoking Status Former   Packs/day: 0.15   Types: Cigarettes   Quit date: 10/31/2018   Years since quitting: 2.5  Smokeless Tobacco Never   Social  History   Substance and Sexual Activity  Alcohol Use Not Currently   Comment: stopped May 25    Family History:  No family history on file.  Past medical history, surgical history, medications, allergies, family history and social history reviewed with patient today and changes made to appropriate areas of the chart.      Objective:    BP 122/82   Pulse 90   Temp 98.7 F (37.1 C)   Resp 18   Ht 5' 9.5" (1.765 m)   Wt 281 lb 4.8 oz (127.6 kg)   SpO2 96%   BMI 40.95 kg/m   Wt Readings from Last 3 Encounters:  05/06/21 281 lb 4.8 oz (127.6 kg)  09/23/20 270 lb (122.5 kg)  01/13/16 280 lb (127 kg)    Physical Exam Constitutional:      Appearance: He is obese.  HENT:     Head: Normocephalic.     Right Ear: External ear normal.     Left Ear: External ear normal.  Cardiovascular:     Rate and Rhythm: Normal rate and regular rhythm.     Heart sounds: Normal heart sounds. No murmur heard. Pulmonary:     Effort: No respiratory distress.     Breath sounds: Normal breath sounds.  Abdominal:     General: Bowel sounds are normal.     Palpations: Abdomen is soft.     Tenderness: There is no abdominal tenderness.  Musculoskeletal:        General: Normal range of motion.     Right lower leg: No edema.     Left lower leg: No edema.  Skin:    General: Skin is warm and dry.  Neurological:     Mental Status: He is alert and oriented to person, place, and time. Mental status is at baseline.  Psychiatric:        Mood and Affect: Mood normal.        Behavior: Behavior normal.        Thought Content: Thought content normal.        Judgment: Judgment normal.    Results for orders placed or performed in visit on 12/15/18  Novel Coronavirus, NAA ([facility])  Result Value Ref Range   SARS-CoV-2, NAA Detected (A) Not Detected      Assessment & Plan:   Problem List Items Addressed This Visit       Digestive   Fatty liver     Other   Alcohol use disorder in remission    In  remission. Continue to monitor.      PTSD (  post-traumatic stress disorder)    Currently doing well, symptoms manageable without treatment. Continue to monitor.      History of tobacco use   Morbid obesity with BMI of 40.0-44.9, adult (HCC)    Contributing to fatty liver, likely OSA, obtaining sleep study. Recommend consistent efforts with diet and exercise. Forms for bariatric surgery reviewed and filled out today, will fax to surgeon office.       Other Visit Diagnoses     Annual physical exam    -  Primary   Relevant Orders   HIV antibody (with reflex)   Hepatitis C antibody   Comprehensive metabolic panel   CBC   Hemoglobin A1c   Lipid panel   Snoring       Relevant Orders   Nocturnal polysomnography (NPSG)        LABORATORY TESTING:  Health maintenance labs ordered today as discussed above.   The natural history of prostate cancer and ongoing controversy regarding screening and potential treatment outcomes of prostate cancer has been discussed with the patient. The meaning of a false positive PSA and a false negative PSA has been discussed. He indicates understanding of the limitations of this screening test and wishes to proceed with screening PSA testing at age-appropriate time.   IMMUNIZATIONS:   - Tdap: Tetanus vaccination status reviewed: last tetanus booster within 10 years. - Influenza: Refused - Pneumococcal: Not applicable - HPV: Not applicable - Shingrix vaccine: Not applicable - COVID vaccine: refused  SCREENING: - Colonoscopy: Not applicable  Discussed with patient purpose of the colonoscopy is to detect colon cancer at curable precancerous or early stages   - AAA Screening: Not applicable  - Lung cancer screening: n/a  Hep C Screening: due STD testing and prevention (HIV/chl/gon/syphilis): due Sexual History: Incontinence Symptoms: none  PATIENT COUNSELING:    Sexuality: Discussed sexually transmitted diseases, partner selection, use of  condoms, avoidance of unintended pregnancy  and contraceptive alternatives.   Advised to avoid cigarette smoking.  I discussed with the patient that most people either abstain from alcohol or drink within safe limits (<=14/week and <=4 drinks/occasion for males, <=7/weeks and <= 3 drinks/occasion for females) and that the risk for alcohol disorders and other health effects rises proportionally with the number of drinks per week and how often a drinker exceeds daily limits.  Discussed cessation/primary prevention of drug use and availability of treatment for abuse.   Diet: Encouraged to adjust caloric intake to maintain  or achieve ideal body weight, to reduce intake of dietary saturated fat and total fat, to limit sodium intake by avoiding high sodium foods and not adding table salt, and to maintain adequate dietary potassium and calcium preferably from fresh fruits, vegetables, and low-fat dairy products.    stressed the importance of regular exercise  Injury prevention: Discussed safety belts, safety helmets, smoke detector, smoking near bedding or upholstery.   Dental health: Discussed importance of regular tooth brushing, flossing, and dental visits.   Follow up plan: NEXT PREVENTATIVE PHYSICAL DUE IN 1 YEAR. No follow-ups on file.

## 2021-05-07 LAB — HIV ANTIBODY (ROUTINE TESTING W REFLEX): HIV 1&2 Ab, 4th Generation: NONREACTIVE

## 2021-05-19 ENCOUNTER — Telehealth: Payer: Self-pay

## 2021-05-19 ENCOUNTER — Other Ambulatory Visit: Payer: Self-pay | Admitting: Emergency Medicine

## 2021-05-19 DIAGNOSIS — R0683 Snoring: Secondary | ICD-10-CM

## 2021-05-19 NOTE — Telephone Encounter (Signed)
I put in a new order for pulmonology under your name for sleep study and informed patient

## 2021-05-19 NOTE — Telephone Encounter (Signed)
Copied from CRM [id number]. Topic: General - Other >> May 19, 2021  1:00 PM Jaquita Rector A wrote: Reason for CRM: Patient called in to inquire about a referral that was placed for him to have a sleep study would  like a call back with an update ASAP please at  Ph# [phone]

## 2021-05-28 ENCOUNTER — Other Ambulatory Visit: Payer: Self-pay | Admitting: Surgery

## 2021-06-04 ENCOUNTER — Ambulatory Visit
Admission: RE | Admit: 2021-06-04 | Discharge: 2021-06-04 | Disposition: A | Discharge status: Home / Self Care | Payer: 59 | Source: Ambulatory Visit | Attending: *Deleted | Admitting: *Deleted

## 2021-06-04 ENCOUNTER — Other Ambulatory Visit: Payer: Self-pay

## 2021-06-04 ENCOUNTER — Ambulatory Visit
Admission: RE | Admit: 2021-06-04 | Discharge: 2021-06-04 | Disposition: A | Discharge status: Home / Self Care | Payer: 59 | Source: Ambulatory Visit | Attending: Surgery | Admitting: Surgery

## 2021-06-04 DIAGNOSIS — Z01810 Encounter for preprocedural cardiovascular examination: Secondary | ICD-10-CM | POA: Diagnosis not present

## 2021-06-19 ENCOUNTER — Institutional Professional Consult (permissible substitution): Payer: 59 | Admitting: Primary Care

## 2021-06-26 ENCOUNTER — Telehealth: Payer: Self-pay

## 2021-06-26 NOTE — Telephone Encounter (Signed)
Called and LVM in regards upcoming appt with BW on Friday 1/20. Would like to know if patient has ever had a sleep study.

## 2021-06-27 ENCOUNTER — Ambulatory Visit: Payer: 59 | Admitting: Primary Care

## 2021-06-27 ENCOUNTER — Other Ambulatory Visit: Payer: Self-pay

## 2021-06-27 ENCOUNTER — Encounter: Payer: Self-pay | Admitting: Primary Care

## 2021-06-27 VITALS — BP 120/84 | HR 79 | Temp 98.0°F | Ht 70.0 in | Wt 298.8 lb

## 2021-06-27 DIAGNOSIS — R0683 Snoring: Secondary | ICD-10-CM

## 2021-06-27 DIAGNOSIS — R400 Somnolence: Secondary | ICD-10-CM

## 2021-06-27 NOTE — Progress Notes (Signed)
@Patient  ID: [name], male    DOB: 09-09-1980, 41 y.o.   MRN: [id number]  Chief Complaint  Patient presents with   Consult    Referring provider: No ref. provider found  HPI: 41 year old male, former smoke quit in 2020. PMH significant for snoring, PTSD, anxiety/depression, covid-19, morbid obesity.  06/27/2021 Patient presents today for sleep consult. He has symptoms of loud snoring, witnessed apnea and daytime sleepiness. Symptoms have been present for He has never had sleep study. Typical bedtime is between 10-11pm. It takes him 10-15 mins to fall asleep. He wakes up on average 1-2 times at night. He starts his day at 5am. Weight is up. Epworth 12. Denies narcolepsy or cataplexy.   Sleep questionnaire: Prior sleep study- None Symptoms- loud snoring, witnessed apnea, daytime sleepiness Bedtime- 10-11pm Time to fall asleep-10-15 mins Nocturnal awakenings-1-2 times Start of day- 5am Weight changes- gained weight  Epworth- 12  No Known Allergies  Immunization History  Administered Date(s) Administered   Anthrax 07/04/2007, 08/03/2007, 03/23/2008   Hepatitis A, Adult 07/02/2001, 03/11/2002   Hepatitis B, adult 07/01/2002, 10/23/2002, 09/05/2003, 09/24/2003   IPV 01/06/2001   Influenza-Unspecified 07/01/2002, 05/12/2007, 03/01/2008, 05/02/2008   MMR 01/18/2001   Meningococcal Conjugate 01/06/2001   Smallpox 07/04/2007   Td 01/06/2001   Tetanus 09/13/2003   Typhoid Inactivated 10/23/2002, 07/22/2006   Varicella 10/23/2002    Past Medical History:  Diagnosis Date   Anxiety    Chronic knee pain    Depression    Herniated disc     Tobacco History: Social History   Tobacco Use  Smoking Status Former   Packs/day: 0.15   Types: Cigarettes   Quit date: 10/31/2018   Years since quitting: 2.6  Smokeless Tobacco Never   Counseling given: Not Answered   No outpatient medications prior to visit.   No facility-administered medications prior to visit.     Review of Systems  Review of Systems  Constitutional:  Positive for fatigue.  HENT: Negative.    Respiratory: Negative.    Psychiatric/Behavioral:  Positive for sleep disturbance.     Physical Exam  BP 120/84 (BP Location: Left Arm, Patient Position: Sitting, Cuff Size: Normal)    Pulse 79    Temp 98 F (36.7 C) (Oral)    Ht 5' 10"  (1.778 m)    Wt 298 lb 12.8 oz (135.5 kg)    SpO2 98%    BMI 42.87 kg/m  Physical Exam Constitutional:      Appearance: Normal appearance.  HENT:     Head: Normocephalic and atraumatic.     Mouth/Throat:     Mouth: Mucous membranes are moist.     Pharynx: Oropharynx is clear.     Comments: Mallampati class III Cardiovascular:     Rate and Rhythm: Normal rate and regular rhythm.  Pulmonary:     Effort: Pulmonary effort is normal.     Breath sounds: Normal breath sounds.  Skin:    General: Skin is warm and dry.  Neurological:     General: No focal deficit present.     Mental Status: He is alert and oriented to person, place, and time. Mental status is at baseline.  Psychiatric:        Mood and Affect: Mood normal.        Behavior: Behavior normal.        Thought Content: Thought content normal.        Judgment: Judgment normal.     Lab Results:  CBC    Component Value Date/Time   WBC 8.0 06/27/2014 0457   RBC 4.94 06/27/2014 0457   HGB 15.8 06/27/2014 0457   HCT 47.0 06/27/2014 0457   PLT 205 06/27/2014 0457   MCV 95 06/27/2014 0457   MCH 32.0 06/27/2014 0457   MCHC 33.7 06/27/2014 0457   RDW 12.7 06/27/2014 0457   LYMPHSABS 3.0 06/27/2014 0457   MONOABS 0.8 06/27/2014 0457   EOSABS 0.2 06/27/2014 0457   BASOSABS 0.0 06/27/2014 0457    BMET    Component Value Date/Time   NA 137 06/27/2014 0457   K 4.1 06/27/2014 0457   CL 106 06/27/2014 0457   CO2 29 06/27/2014 0457   GLUCOSE 87 06/27/2014 0457   BUN 8 06/27/2014 0457   CREATININE 0.87 06/27/2014 0457   CALCIUM 8.3 (L) 06/27/2014 0457   GFRNONAA >60 06/27/2014 0457    GFRNONAA >60 02/01/2013 1052   GFRAA >60 06/27/2014 0457   GFRAA >60 02/01/2013 1052    BNP No results found for: BNP  ProBNP No results found for: PROBNP  Imaging: DG Chest 2 View  Result Date: 06/04/2021 CLINICAL DATA:  41 year old male with morbid obesity, preoperative study EXAM: CHEST - 2 VIEW COMPARISON:  02/01/2013 FINDINGS: The heart size and mediastinal contours are within normal limits. Both lungs are clear. The visualized skeletal structures are unremarkable. IMPRESSION: No active cardiopulmonary disease. Electronically Signed   By: Corrie Mckusick D.O.   On: 06/04/2021 10:17   DG UGI W DOUBLE CM (HD BA)  Result Date: 06/04/2021 CLINICAL DATA:  Morbid obesity, preop gastric sleeve procedure EXAM: UPPER GI SERIES WITH KUB TECHNIQUE: After obtaining a scout radiograph a routine upper GI series was performed using thin and high density barium. FLUOROSCOPY TIME:  Fluoroscopy Time:  0.4 minute Radiation Exposure Index (if provided by the fluoroscopic device): 5.6 mGy Number of Acquired Spot Images: 0 COMPARISON:  None. FINDINGS: KUB: No bowel dilatation to suggest obstruction. No evidence of pneumoperitoneum, portal venous gas or pneumatosis. No pathologic calcifications along the expected course of the ureters. No acute osseous abnormality. UPPER GI SERIES: Examination of the esophagus demonstrated normal esophageal motility. Normal esophageal morphology without evidence of esophagitis or ulceration. No esophageal stricture, diverticula, or mass lesion. No evidence of hiatal hernia. Mild gastroesophageal reflux. Examination of the stomach demonstrated normal rugal folds and areae gastricae. Gastric mucosa appeared unremarkable without evidence of ulceration, scarring, or mass lesion. Gastric motility and emptying was normal. Fluoroscopic examination of the duodenum demonstrates normal motility and morphology without evidence of ulceration or mass lesion. IMPRESSION: 1. Mild  gastroesophageal reflux. 2. Otherwise, normal upper GI. Electronically Signed   By: Kathreen Devoid M.D.   On: 06/04/2021 09:54     Assessment & Plan:   Loud snoring - Patient has symptoms of loud snoring, witnessed apnea, daytime sleepiness. Epworth 12. BMI >35. Concern patient could have obstructive sleep apnea, needs home sleep study to evaluate. Discussed risk of untreated sleep apnea including cardiac arrhthymias, pulm HTN, stroke, DM. We briefly reviewed treatment options. Encourage weight loss. Advised against driving if experiencing excessive daytime sleepiness. Follow-up in 6 weeks to review sleep study results.    Martyn Ehrich, NP 06/27/2021

## 2021-06-27 NOTE — Patient Instructions (Addendum)
Recommendations: Focus on side sleeping position or elevata head of bed at night Work on weight loss efforts Do not drive if experiencing excessive daytime sleepiness  Orders: Hom sleep study   Follow-up: 6-8 weeks to review sleep study results (virtual ok)    Sleep Apnea Sleep apnea is a condition in which breathing pauses or becomes shallow during sleep. People with sleep apnea usually snore loudly. They may have times when they gasp and stop breathing for 10 seconds or more during sleep. This may happen many times during the night. Sleep apnea disrupts your sleep and keeps your body from getting the rest that it needs. This condition can increase your risk of certain health problems, including: Heart attack. Stroke. Obesity. Type 2 diabetes. Heart failure. Irregular heartbeat. High blood pressure. The goal of treatment is to help you breathe normally again. What are the causes? The most common cause of sleep apnea is a collapsed or blocked airway. There are three kinds of sleep apnea: Obstructive sleep apnea. This kind is caused by a blocked or collapsed airway. Central sleep apnea. This kind happens when the part of the brain that controls breathing does not send the correct signals to the muscles that control breathing. Mixed sleep apnea. This is a combination of obstructive and central sleep apnea. What increases the risk? You are more likely to develop this condition if you: Are overweight. Smoke. Have a smaller than normal airway. Are older. Are male. Drink alcohol. Take sedatives or tranquilizers. Have a family history of sleep apnea. Have a tongue or tonsils that are larger than normal. What are the signs or symptoms? Symptoms of this condition include: Trouble staying asleep. Loud snoring. Morning headaches. Waking up gasping. Dry mouth or sore throat in the morning. Daytime sleepiness and tiredness. If you have daytime fatigue because of sleep apnea, you  may be more likely to have: Trouble concentrating. Forgetfulness. Irritability or mood swings. Personality changes. Feelings of depression. Sexual dysfunction. This may include loss of interest if you are male, or erectile dysfunction if you are male. How is this diagnosed? This condition may be diagnosed with: A medical history. A physical exam. A series of tests that are done while you are sleeping (sleep study). These tests are usually done in a sleep lab, but they may also be done at home. How is this treated? Treatment for this condition aims to restore normal breathing and to ease symptoms during sleep. It may involve managing health issues that can affect breathing, such as high blood pressure or obesity. Treatment may include: Sleeping on your side. Using a decongestant if you have nasal congestion. Avoiding the use of depressants, including alcohol, sedatives, and narcotics. Losing weight if you are overweight. Making changes to your diet. Quitting smoking. Using a device to open your airway while you sleep, such as: An oral appliance. This is a custom-made mouthpiece that shifts your lower jaw forward. A continuous positive airway pressure (CPAP) device. This device blows air through a mask when you breathe out (exhale). A nasal expiratory positive airway pressure (EPAP) device. This device has valves that you put into each nostril. A bi-level positive airway pressure (BIPAP) device. This device blows air through a mask when you breathe in (inhale) and breathe out (exhale). Having surgery if other treatments do not work. During surgery, excess tissue is removed to create a wider airway. Follow these instructions at home: Lifestyle Make any lifestyle changes that your health care provider recommends. Eat a healthy, well-balanced diet.  Take steps to lose weight if you are overweight. Avoid using depressants, including alcohol, sedatives, and narcotics. Do not use any  products that contain nicotine or tobacco. These products include cigarettes, chewing tobacco, and vaping devices, such as e-cigarettes. If you need help quitting, ask your health care provider. General instructions Take over-the-counter and prescription medicines only as told by your health care provider. If you were given a device to open your airway while you sleep, use it only as told by your health care provider. If you are having surgery, make sure to tell your health care provider you have sleep apnea. You may need to bring your device with you. Keep all follow-up visits. This is important. Contact a health care provider if: The device that you received to open your airway during sleep is uncomfortable or does not seem to be working. Your symptoms do not improve. Your symptoms get worse. Get help right away if: You develop: Chest pain. Shortness of breath. Discomfort in your back, arms, or stomach. You have: Trouble speaking. Weakness on one side of your body. Drooping in your face. These symptoms may represent a serious problem that is an emergency. Do not wait to see if the symptoms will go away. Get medical help right away. Call your local emergency services (911 in the [place] Do not drive yourself to the hospital. Summary Sleep apnea is a condition in which breathing pauses or becomes shallow during sleep. The most common cause is a collapsed or blocked airway. The goal of treatment is to restore normal breathing and to ease symptoms during sleep. This information is not intended to replace advice given to you by your health care provider. Make sure you discuss any questions you have with your health care provider. Document Revised: 01/01/2021 Document Reviewed: 05/03/2020 Elsevier Patient Education  2022 [employer].

## 2021-06-27 NOTE — Assessment & Plan Note (Addendum)
-   Patient has symptoms of loud snoring, witnessed apnea, daytime sleepiness. Epworth 12. BMI >35. Concern patient could have obstructive sleep apnea, needs home sleep study to evaluate. Discussed risk of untreated sleep apnea including cardiac arrhthymias, pulm HTN, stroke, DM. We briefly reviewed treatment options. Encourage weight loss. Advised against driving if experiencing excessive daytime sleepiness. Follow-up in 6 weeks to review sleep study results.

## 2021-06-30 NOTE — Progress Notes (Signed)
Reviewed and agree with assessment/plan. ° ° °Joscelin Fray, MD °[facility] °06/30/2021, 8:51 AM °Pager:  [phone] ° °

## 2021-07-07 ENCOUNTER — Encounter: Payer: 59 | Attending: Surgery | Admitting: Skilled Nursing Facility1

## 2021-07-07 ENCOUNTER — Other Ambulatory Visit: Payer: Self-pay

## 2021-07-07 ENCOUNTER — Encounter: Payer: Self-pay | Admitting: Skilled Nursing Facility1

## 2021-07-07 DIAGNOSIS — Z6841 Body Mass Index (BMI) 40.0 and over, adult: Secondary | ICD-10-CM | POA: Diagnosis present

## 2021-07-07 NOTE — Progress Notes (Signed)
Nutrition Assessment for Bariatric Surgery Medical Nutrition Therapy  Patient was seen on 07/07/2021 for Pre-Operative Nutrition Assessment. Letter of approval faxed to [facility] bariatric surgery program coordinator on 07/07/2021.   Referral stated Supervised Weight Loss (SWL) visits needed: 0  Pt completed visits.   Pt has cleared nutrition requirements.    Planned surgery: sleeve gastrectomy  Pt expectation of surgery: to lose weight and reduce his overall stress Pt expectation of dietitian: none identified     NUTRITION ASSESSMENT   Anthropometrics  Start weight at [facility]: 282 lbs (date: 07/07/2021)  Height: 70 in BMI: 40.46 kg/m2     Clinical  Medical hx: anxiety, depression  Medications: N/A  Labs: vitamin D 16, LDL 115 Notable signs/symptoms: headaches a couple times a week Any previous deficiencies? No  Micronutrient Nutrition Focused Physical Exam: Hair: No issues observed Eyes: No issues observed Mouth: No issues observed Neck: No issues observed Nails: No issues observed Skin: No issues observed  Lifestyle & Dietary Hx  Pt states he did take medication for anxiety and depression but felt it was not working so he keeps his mind busy to deal with it now.    Pt states he does feel blue a couple times a month but just moves through it.  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 Pt states he does get headaches multiple times a  weeks stating he did not think this was a problem.  Pt states his wife is emotionally supportive.  Pt states he is the main cook in the house.  Pt states he does recognizes his portions are too big.   24-Hr Dietary Recall First Meal: skipped or granola bar or cereal Snack: starbucks iced coffee Second Meal: cafeteria at work (chicken + rice + salad) Snack: cookies Third Meal: chicken + potato + rice + salad or pork chop + rice Snack:  Beverages: water, cranberry juice, lemonade, monster energy  drinks   Estimated Energy Needs Calories: 2000   NUTRITION DIAGNOSIS  Overweight/obesity ([place]-3.3) related to past poor dietary habits and physical inactivity as evidenced by patient w/ planned sleeve gastrectomy surgery following dietary guidelines for continued weight loss.    NUTRITION INTERVENTION  Nutrition counseling (C-1) and education (E-2) to facilitate bariatric surgery goals.  Educated pt on micronutrient deficiencies post surgery and strategies to mitigate that risk  Educated pt on the amount of sugar in his beverages    Pre-Op Goals Reviewed with the Patient Track food and beverage intake (pen and paper, MyFitness Pal, Baritastic app, etc.) Make healthy food choices while monitoring portion sizes Consume 3 meals per day or try to eat every 3-5 hours Avoid concentrated sugars and fried foods Keep sugar & fat in the single digits per serving on food labels Practice CHEWING your food (aim for applesauce consistency) Practice not drinking 15 minutes before, during, and 30 minutes after each meal and snack Avoid all carbonated beverages (ex: soda, sparkling beverages)  Limit caffeinated beverages (ex: coffee, tea, energy drinks) Avoid all sugar-sweetened beverages (ex: regular soda, sports drinks)  Avoid alcohol  Aim for 64-100 ounces of FLUID daily (with at least half of fluid intake being plain water)  Aim for at least 60-80 grams of PROTEIN daily Look for a liquid protein source that contains ?15 g protein and ?5 g carbohydrate (ex: shakes, drinks, shots) Make a list of non-food related activities Physical activity is an important part of a healthy lifestyle so keep it moving! The goal  is to reach 150 minutes of exercise per week, including cardiovascular and weight baring activity. Get an OTC vitamin D supplement 1000 IU  *Goals that are bolded indicate the pt would like to start working towards these  Handouts Provided Include  Bariatric Surgery handouts (Nutrition  Visits, Pre-Op Goals, Protein Shakes, Vitamins & Minerals)  Learning Style & Readiness for Change Teaching method utilized: Visual & Auditory  Demonstrated degree of understanding via: Teach Back  Readiness Level: contemplative Barriers to learning/adherence to lifestyle change: unknown    MONITORING & EVALUATION Dietary intake, weekly physical activity, body weight, and pre-op goals reached at next nutrition visit.    Next Steps  Patient is to follow up at [facility] for Pre-Op Class >2 weeks before surgery for further nutrition education.  Pt has completed visits. No further supervised visits required/recomended

## 2021-07-23 ENCOUNTER — Other Ambulatory Visit: Payer: Self-pay

## 2021-07-23 ENCOUNTER — Ambulatory Visit: Payer: 59 | Admitting: Primary Care

## 2021-07-23 DIAGNOSIS — R400 Somnolence: Secondary | ICD-10-CM

## 2021-07-23 DIAGNOSIS — G4733 Obstructive sleep apnea (adult) (pediatric): Secondary | ICD-10-CM

## 2021-07-24 DIAGNOSIS — G4733 Obstructive sleep apnea (adult) (pediatric): Secondary | ICD-10-CM | POA: Diagnosis not present

## 2021-07-25 NOTE — Progress Notes (Signed)
HST 07/23/21 showed severe OSA (AHI 43/hr), see if patient can move up apt to discuss treatment options

## 2021-07-30 ENCOUNTER — Ambulatory Visit: Payer: Self-pay | Admitting: Surgery

## 2021-07-30 ENCOUNTER — Telehealth (INDEPENDENT_AMBULATORY_CARE_PROVIDER_SITE_OTHER): Payer: 59 | Admitting: Primary Care

## 2021-07-30 DIAGNOSIS — G4730 Sleep apnea, unspecified: Secondary | ICD-10-CM | POA: Diagnosis not present

## 2021-07-30 DIAGNOSIS — E662 Morbid (severe) obesity with alveolar hypoventilation: Secondary | ICD-10-CM

## 2021-07-30 NOTE — Patient Instructions (Addendum)
Sleep study shows you have severe obstructive sleep apnea, you had on average 43 apneic events an hour Treatment recommended is starting CPAP therapy, we placed and order and a medical equipment store will contact you once they have a device available for you and they will show you how to use   Recommendations: Aim to wear CPAP every night 4-6 hours or longer Do not drive if experiencing excessive daytime sleepiness Work on weight loss   Orders: CPAP auto titrate 5-20H20   Follow-up: 31-90 days after starting CPAP for compliance (please call once you get CPAP to make follow-up visit, I did put in recall for 3 months tentatively)    CPAP and BIPAP Information CPAP and BIPAP are methods that use air pressure to keep your airways open and to help you breathe well. CPAP and BIPAP use different amounts of pressure. Your health care provider will tell you whether CPAP or BIPAP would be more helpful for you. CPAP stands for "continuous positive airway pressure." With CPAP, the amount of pressure stays the same while you breathe in (inhale) and out (exhale). BIPAP stands for "bi-level positive airway pressure." With BIPAP, the amount of pressure will be higher when you inhale and lower when you exhale. This allows you to take larger breaths. CPAP or BIPAP may be used in the hospital, or your health care provider may want you to use it at home. You may need to have a sleep study before your health care provider can order a machine for you to use at home. What are the advantages? CPAP or BIPAP can be helpful if you have: Sleep apnea. Chronic obstructive pulmonary disease (COPD). Heart failure. Medical conditions that cause muscle weakness, including muscular dystrophy or amyotrophic lateral sclerosis (ALS). Other problems that cause breathing to be shallow, weak, abnormal, or difficult. CPAP and BIPAP are most commonly used for obstructive sleep apnea (OSA) to keep the airways from collapsing when  the muscles relax during sleep. What are the risks? Generally, this is a safe treatment. However, problems may occur, including: Irritated skin or skin sores if the mask does not fit properly. Dry or stuffy nose or nosebleeds. Dry mouth. Feeling gassy or bloated. Sinus or lung infection if the equipment is not cleaned properly. When should CPAP or BIPAP be used? In most cases, the mask only needs to be worn during sleep. Generally, the mask needs to be worn throughout the night and during any daytime naps. People with certain medical conditions may also need to wear the mask at other times, such as when they are awake. Follow instructions from your health care provider about when to use the machine. What happens during CPAP or BIPAP? Both CPAP and BIPAP are provided by a small machine with a flexible plastic tube that attaches to a plastic mask that you wear. Air is blown through the mask into your nose or mouth. The amount of pressure that is used to blow the air can be adjusted on the machine. Your health care provider will set the pressure setting and help you find the best mask for you. Tips for using the mask Because the mask needs to be snug, some people feel trapped or closed-in (claustrophobic) when first using the mask. If you feel this way, you may need to get used to the mask. One way to do this is to hold the mask loosely over your nose or mouth and then gradually apply the mask more snugly. You can also gradually increase the  amount of time that you use the mask. Masks are available in various types and sizes. If your mask does not fit well, talk with your health care provider about getting a different one. Some common types of masks include: Full face masks, which fit over the mouth and nose. Nasal masks, which fit over the nose. Nasal pillow or prong masks, which fit into the nostrils. If you are using a mask that fits over your nose and you tend to breathe through your mouth, a chin  strap may be applied to help keep your mouth closed. Use a skin barrier to protect your skin as told by your health care provider. Some CPAP and BIPAP machines have alarms that may sound if the mask comes off or develops a leak. If you have trouble with the mask, it is very important that you talk with your health care provider about finding a way to make the mask easier to tolerate. Do not stop using the mask. There could be a negative impact on your health if you stop using the mask. Tips for using the machine Place your CPAP or BIPAP machine on a secure table or stand near an electrical outlet. Know where the on/off switch is on the machine. Follow instructions from your health care provider about how to set the pressure on your machine and when you should use it. Do not eat or drink while the CPAP or BIPAP machine is on. Food or fluids could get pushed into your lungs by the pressure of the CPAP or BIPAP. For home use, CPAP and BIPAP machines can be rented or purchased through home health care companies. Many different brands of machines are available. Renting a machine before purchasing may help you find out which particular machine works well for you. Your health insurance company may also decide which machine you may get. Keep the CPAP or BIPAP machine and attachments clean. Ask your health care provider for specific instructions. Check the humidifier if you have a dry stuffy nose or nosebleeds. Make sure it is working correctly. Follow these instructions at home: Take over-the-counter and prescription medicines only as told by your health care provider. Ask if you can take sinus medicine if your sinuses are blocked. Do not use any products that contain nicotine or tobacco. These products include cigarettes, chewing tobacco, and vaping devices, such as e-cigarettes. If you need help quitting, ask your health care provider. Keep all follow-up visits. This is important. Contact a health care  provider if: You have redness or pressure sores on your head, face, mouth, or nose from the mask or head gear. You have trouble using the CPAP or BIPAP machine. You cannot tolerate wearing the CPAP or BIPAP mask. Someone tells you that you snore even when wearing your CPAP or BIPAP. Get help right away if: You have trouble breathing. You feel confused. Summary CPAP and BIPAP are methods that use air pressure to keep your airways open and to help you breathe well. If you have trouble with the mask, it is very important that you talk with your health care provider about finding a way to make the mask easier to tolerate. Do not stop using the mask. There could be a negative impact to your health if you stop using the mask. Follow instructions from your health care provider about when to use the machine. This information is not intended to replace advice given to you by your health care provider. Make sure you discuss any questions you  have with your health care provider. Document Revised: 01/01/2021 Document Reviewed: 05/03/2020 Elsevier Patient Education  2022 [employer].

## 2021-07-30 NOTE — Progress Notes (Signed)
Virtual Visit via Video Note  I connected with Nicholas Haney on 07/30/21 at  4:30 PM EST by a video enabled telemedicine application and verified that I am speaking with the correct person using two identifiers.  Location: Patient: Home Provider: Office    I discussed the limitations of evaluation and management by telemedicine and the availability of in person appointments. The patient expressed understanding and agreed to proceed.  History of Present Illness: 41 year old male, former smoke quit in 2020. PMH significant for snoring, PTSD, anxiety/depression, covid-19, morbid obesity.  Previous [facility] pulmonary encounter: 06/27/2021 Patient presents today for sleep consult. He has symptoms of loud snoring, witnessed apnea and daytime sleepiness. He has never had sleep study. Typical bedtime is between 10-11pm. It takes him 10-15 mins to fall asleep. He wakes up on average 1-2 times at night. He starts his day at 5am. Weight is up. Epworth 12. Denies narcolepsy or cataplexy.   Sleep questionnaire: Prior sleep study- None Symptoms- loud snoring, witnessed apnea, daytime sleepiness Bedtime- 10-11pm Time to fall asleep-10-15 mins Nocturnal awakenings-1-2 times Start of day- 5am Weight changes- gained weight  Epworth- 12  07/30/2021- Interim hx  Patient contacted today for virtual visit to review sleep study results. He had home sleep study on 07/23/21 that showed severe OSA (AHI 43/hr) with SpO2 low 68%. Reviewed sleep study results with patient and treatment options including weight loss, oral appliance, CPAP or referral to ENT. Recommending patient be started on CPAP therapy d.t severity of sleep apnea. He is in agreement with plan.   Observations/Objective:  Appear well; No overt respiratory symptoms  Assessment and Plan:  Severe sleep apnea: - HST 07/23/21 that showed severe OSA (AHI 43/hr) with SpO2 low 68% (average 93%). Weight 298lbs. Starting CPAP auto titrate 5-20cm h20 with  mask of choice. Instructed patient to wear CPAP every night for 4-6 hours or longer. Encourage weight loss effort. Advised against driving if experiencing excessive daytime sleepiness.   Follow Up Instructions: - FU 31-90 days after stating CPAP for compliance check (Recall placed for 3 months)   I discussed the assessment and treatment plan with the patient. The patient was provided an opportunity to ask questions and all were answered. The patient agreed with the plan and demonstrated an understanding of the instructions.   The patient was advised to call back or seek an in-person evaluation if the symptoms worsen or if the condition fails to improve as anticipated.  I provided 22 minutes of non-face-to-face time during this encounter.   Glenford Bayley, NP

## 2021-07-31 NOTE — Progress Notes (Signed)
Reviewed and agree with assessment/plan.   Coralyn Helling, MD [facility] 07/31/2021, 8:33 AM Pager:  [phone]

## 2021-08-11 ENCOUNTER — Encounter: Payer: 59 | Admitting: Skilled Nursing Facility1

## 2021-08-13 ENCOUNTER — Encounter: Payer: 59 | Admitting: Primary Care

## 2021-08-13 ENCOUNTER — Other Ambulatory Visit: Payer: Self-pay

## 2021-08-13 NOTE — Progress Notes (Deleted)
Virtual Visit via Video Note ? ?I connected with Brand Siever on 08/13/21 at  9:00 AM EST by a video enabled telemedicine application and verified that I am speaking with the correct person using two identifiers. ? ?Location: ?Patient: *** ?Provider: *** ?  ?I discussed the limitations of evaluation and management by telemedicine and the availability of in person appointments. The patient expressed understanding and agreed to proceed. ? ?History of Present Illness: ? ?41 year old male, former smoke quit in 2020. PMH significant for snoring, PTSD, anxiety/depression, covid-19, morbid obesity. ? ?Previous [facility] pulmonary encounter: ?06/27/2021 ?Patient presents today for sleep consult. He has symptoms of loud snoring, witnessed apnea and daytime sleepiness. He has never had sleep study. Typical bedtime is between 10-11pm. It takes him 10-15 mins to fall asleep. He wakes up on average 1-2 times at night. He starts his day at 5am. Weight is up. Epworth 12. Denies narcolepsy or cataplexy.  ? ?Sleep questionnaire: ?Prior sleep study- None ?Symptoms- loud snoring, witnessed apnea, daytime sleepiness ?Bedtime- 10-11pm ?Time to fall asleep-10-15 mins ?Nocturnal awakenings-1-2 times ?Start of day- 5am ?Weight changes- gained weight  ?Epworth- 12 ? ?07/30/2021- Interim hx  ?Patient contacted today for virtual visit to review sleep study results. He had home sleep study on 07/23/21 that showed severe OSA (AHI 43/hr) with SpO2 low 68%. Reviewed sleep study results with patient and treatment options including weight loss, oral appliance, CPAP or referral to ENT. Recommending patient be started on CPAP therapy d.t severity of sleep apnea. He is in agreement with plan.  ?  ?Observations/Objective: ? ? ?Assessment and Plan: ? ? ?Follow Up Instructions: ? ?  ?I discussed the assessment and treatment plan with the patient. The patient was provided an opportunity to ask questions and all were answered. The patient agreed with the  plan and demonstrated an understanding of the instructions. ?  ?The patient was advised to call back or seek an in-person evaluation if the symptoms worsen or if the condition fails to improve as anticipated. ? ?I provided *** minutes of non-face-to-face time during this encounter. ? ? ?Glenford Bayley, NP ? ?

## 2021-08-18 ENCOUNTER — Encounter: Payer: 59 | Attending: Surgery | Admitting: Skilled Nursing Facility1

## 2021-08-18 ENCOUNTER — Other Ambulatory Visit: Payer: Self-pay

## 2021-08-18 DIAGNOSIS — Z6841 Body Mass Index (BMI) 40.0 and over, adult: Secondary | ICD-10-CM | POA: Insufficient documentation

## 2021-08-18 NOTE — Progress Notes (Signed)
Pre-Operative Nutrition Class:   ? ?Patient was seen on 08/18/2021 for Pre-Operative Bariatric Surgery Education [facility]   ? ?Surgery date: 09/09/2021 ?Surgery type: sleeve ?Start weight at [facility]: 282 ?Weight today: 284 ? ?Samples given per MNT protocol. Patient educated on appropriate usage: ?Bariatric Advantage Multivitamin ?Lot # [id number] ?Exp:10/23 ? ?Ensure max  Shake ?Lot # [id number] ?Exp: 09/06/2021 ? ?The following the learning objectives were met by the patient during this course: ?Identify Pre-Op Dietary Goals and will begin 2 weeks pre-operatively ?Identify appropriate sources of fluids and proteins  ?State protein recommendations and appropriate sources pre and post-operatively ?Identify Post-Operative Dietary Goals and will follow for 2 weeks post-operatively ?Identify appropriate multivitamin and calcium sources ?Describe the need for physical activity post-operatively and will follow MD recommendations ?State when to call healthcare provider regarding medication questions or post-operative complications ?When having a diagnosis of diabetes understanding hypoglycemia symptoms and the inclusion of 1 complex carbohydrate per meal ? ?Handouts given during class include: ?Pre-Op Bariatric Surgery Diet Handout ?Protein Shake Handout ?Post-Op Bariatric Surgery Nutrition Handout ?[facility] Program Information Flyer ?Support Group Information Flyer ?[facility] Bariatric Supplements Price List ? ?Follow-Up Plan: ?Patient will follow-up at [facility] 2 weeks post operatively for diet advancement per MD. ? ?

## 2021-08-19 ENCOUNTER — Ambulatory Visit
Admission: RE | Admit: 2021-08-19 | Discharge: 2021-08-19 | Disposition: A | Discharge status: Home / Self Care | Payer: 59 | Source: Ambulatory Visit | Attending: Nurse Practitioner | Admitting: Nurse Practitioner

## 2021-08-19 VITALS — BP 123/82 | HR 80 | Temp 98.8°F | Resp 18

## 2021-08-19 DIAGNOSIS — B349 Viral infection, unspecified: Secondary | ICD-10-CM | POA: Diagnosis not present

## 2021-08-19 NOTE — ED Provider Notes (Signed)
?[facility] ? ? ? ?CSN: [id number] ?Arrival date & time: 08/19/21  1009 ? ? ?  ? ?History   ?Chief Complaint ?Chief Complaint  ?Patient presents with  ? Cough  ? Headache  ? Sore Throat  ? ? ?HPI ?Nicholas Haney is a 41 y.o. male presenting with cough, sore throat, headache for 3 days.  History noncontributory, denies history of cardiopulmonary disease.  Cough is nonproductive.  Subjective chills, has not monitored temperature at home.  Sore throat is improving.  Has attempted 3 pills of Tylenol with minimal relief.  Tolerating fluids and food.  States he wants a work note for 3 days. ? ?HPI ? ?Past Medical History:  ?Diagnosis Date  ? Anxiety   ? Chronic knee pain   ? Depression   ? Herniated disc   ? ? ?Patient Active Problem List  ? Diagnosis Date Noted  ? Loud snoring 06/27/2021  ? Chronic pain 05/06/2021  ? Cannabis dependence (HCC) 05/06/2021  ? Pilonidal cyst without abscess 05/06/2021  ? Fatty liver 05/06/2021  ? Morbid obesity with BMI of 40.0-44.9, adult (HCC) 05/06/2021  ? PTSD (post-traumatic stress disorder) 01/23/2019  ? History of tobacco use 01/23/2019  ? History of 2019 novel coronavirus disease (COVID-19) 01/23/2019  ? Depressive disorder   ? Anxiety   ? Alcohol use disorder in remission 12/07/2018  ? ? ?Past Surgical History:  ?Procedure Laterality Date  ? BACK SURGERY    ? GALLBLADDER SURGERY    ? herniated disc    ? ? ? ? ? ?Home Medications   ? ?Prior to Admission medications   ?Not on File  ? ? ?Family History ?History reviewed. No pertinent family history. ? ?Social History ?Social History  ? ?Tobacco Use  ? Smoking status: Former  ?  Packs/day: 0.15  ?  Types: Cigarettes  ?  Quit date: 10/31/2018  ?  Years since quitting: 2.8  ? Smokeless tobacco: Never  ?Vaping Use  ? Vaping Use: Never used  ?Substance Use Topics  ? Alcohol use: Not Currently  ?  Comment: stopped May 25  ? Drug use: No  ? ? ? ?Allergies   ?Patient has no known allergies. ? ? ?Review of Systems ?Review of  Systems  ?Constitutional:  Negative for appetite change, chills and fever.  ?HENT:  Positive for sore throat. Negative for congestion, ear pain, rhinorrhea, sinus pressure and sinus pain.   ?Eyes:  Negative for redness and visual disturbance.  ?Respiratory:  Negative for cough, chest tightness, shortness of breath and wheezing.   ?Cardiovascular:  Negative for chest pain and palpitations.  ?Gastrointestinal:  Negative for abdominal pain, constipation, diarrhea, nausea and vomiting.  ?Genitourinary:  Negative for dysuria, frequency and urgency.  ?Musculoskeletal:  Negative for myalgias.  ?Neurological:  Negative for dizziness, weakness and headaches.  ?Psychiatric/Behavioral:  Negative for confusion.   ?All other systems reviewed and are negative. ? ? ?Physical Exam ?Triage Vital Signs ?ED Triage Vitals  ?Enc Vitals Group  ?   BP 08/19/21 1033 123/82  ?   Pulse Rate 08/19/21 1033 80  ?   Resp 08/19/21 1033 18  ?   Temp 08/19/21 1033 98.8 ?F (37.1 ?C)  ?   Temp Source 08/19/21 1033 Oral  ?   SpO2 08/19/21 1033 95 %  ?   Weight --   ?   Height --   ?   Head Circumference --   ?   Peak Flow --   ?  Pain Score 08/19/21 1034 0  ?   Pain Loc --   ?   Pain Edu? --   ?   Excl. in GC? --   ? ?No data found. ? ?Updated Vital Signs ?BP 123/82 (BP Location: Left Arm)   Pulse 80   Temp 98.8 ?F (37.1 ?C) (Oral)   Resp 18   SpO2 95%  ? ?Visual Acuity ?Right Eye Distance:   ?Left Eye Distance:   ?Bilateral Distance:   ? ?Right Eye Near:   ?Left Eye Near:    ?Bilateral Near:    ? ?Physical Exam ?Vitals reviewed.  ?Constitutional:   ?   General: He is not in acute distress. ?   Appearance: Normal appearance. He is not ill-appearing.  ?HENT:  ?   Head: Normocephalic and atraumatic.  ?   Right Ear: Tympanic membrane, ear canal and external ear normal. No tenderness. No middle ear effusion. There is no impacted cerumen. Tympanic membrane is not perforated, erythematous, retracted or bulging.  ?   Left Ear: Tympanic membrane, ear  canal and external ear normal. No tenderness.  No middle ear effusion. There is no impacted cerumen. Tympanic membrane is not perforated, erythematous, retracted or bulging.  ?   Nose: Nose normal. No congestion.  ?   Mouth/Throat:  ?   Mouth: Mucous membranes are moist.  ?   Pharynx: Uvula midline. No oropharyngeal exudate or posterior oropharyngeal erythema.  ?Eyes:  ?   Extraocular Movements: Extraocular movements intact.  ?   Pupils: Pupils are equal, round, and reactive to light.  ?Cardiovascular:  ?   Rate and Rhythm: Normal rate and regular rhythm.  ?   Heart sounds: Normal heart sounds.  ?Pulmonary:  ?   Effort: Pulmonary effort is normal.  ?   Breath sounds: Normal breath sounds. No decreased breath sounds, wheezing, rhonchi or rales.  ?Abdominal:  ?   Palpations: Abdomen is soft.  ?   Tenderness: There is no abdominal tenderness. There is no guarding or rebound.  ?Lymphadenopathy:  ?   Cervical: No cervical adenopathy.  ?   Right cervical: No superficial cervical adenopathy. ?   Left cervical: No superficial cervical adenopathy.  ?Neurological:  ?   General: No focal deficit present.  ?   Mental Status: He is alert and oriented to person, place, and time.  ?Psychiatric:     ?   Mood and Affect: Mood normal.     ?   Behavior: Behavior normal.     ?   Thought Content: Thought content normal.     ?   Judgment: Judgment normal.  ? ? ? ?UC Treatments / Results  ?Labs ?(all labs ordered are listed, but only abnormal results are displayed) ?Labs Reviewed - No data to display ? ?EKG ? ? ?Radiology ?No results found. ? ?Procedures ?Procedures (including critical care time) ? ?Medications Ordered in UC ?Medications - No data to display ? ?Initial Impression / Assessment and Plan / UC Course  ?I have reviewed the triage vital signs and the nursing notes. ? ?Pertinent labs & imaging results that were available during my care of the patient were reviewed by me and considered in my medical decision making (see chart for  details). ? ?  ? ?This patient is a very pleasant 41 y.o. year old male presenting with mild viral syndrome x3 days. Afebrile, nontachy. Clinically well appearing. Requesting work note x3 days. Provided. Conservative management with tylenol/ibuprofen, rest, mucinex, etc. ED return precautions discussed.  Patient verbalizes understanding and agreement.  ?.  ? ?Final Clinical Impressions(s) / UC Diagnoses  ? ?Final diagnoses:  ?Viral syndrome  ? ? ? ?Discharge Instructions   ? ?  ?-With a virus, you're typically contagious for 5-7 days, or as long as you're having fevers.  ?-Mucinex, Nyquil, tylenol/ibuprofen ?-You can take Tylenol up to 1000 mg 3 times daily, and ibuprofen up to 600 mg 3 times daily with food.  You can take these together, or alternate every 3-4 hours. ? ? ? ?ED Prescriptions   ?None ?  ? ?PDMP not reviewed this encounter. ?  ?Rhys Martini, PA-C ?08/19/21 1141 ? ?

## 2021-08-19 NOTE — Discharge Instructions (Addendum)
-  With a virus, you're typically contagious for 5-7 days, or as long as you're having fevers.  ?-Mucinex, Nyquil, tylenol/ibuprofen ?-You can take Tylenol up to 1000 mg 3 times daily, and ibuprofen up to 600 mg 3 times daily with food.  You can take these together, or alternate every 3-4 hours. ? ?

## 2021-08-19 NOTE — ED Triage Notes (Signed)
Pt presents with cough, ST, HA x 3 days  ?

## 2021-08-21 ENCOUNTER — Telehealth: Payer: Self-pay | Admitting: Primary Care

## 2021-08-21 NOTE — Telephone Encounter (Signed)
Please call and get this patient set up for a CPAP compliance with [facility] between 09/19/21 and 11/17/2021.  ?

## 2021-08-26 NOTE — Patient Instructions (Signed)
DUE TO COVID-19 ONLY ONE VISITOR  (aged 41 and older)  IS ALLOWED TO COME WITH YOU AND STAY IN THE WAITING ROOM ONLY DURING PRE OP AND PROCEDURE.   ?**NO VISITORS ARE ALLOWED IN THE SHORT STAY AREA OR RECOVERY ROOM!!** ? ?IF YOU WILL BE ADMITTED INTO THE HOSPITAL YOU ARE ALLOWED ONLY TWO SUPPORT PEOPLE DURING VISITATION HOURS ONLY (7 AM -8PM)   ?The support person(s) must pass our screening, gel in and out, and wear a mask at all times, including in the patient?s room. ?Patients must also wear a mask when staff or their support person are in the room. ?Visitors GUEST BADGE MUST BE WORN VISIBLY  ?One adult visitor may remain with you overnight and MUST be in the room by 8 P.M. ?  ? ? Your procedure is scheduled on: 09/09/21 ? ? Report to [facility] Main Entrance ? ?  Report to Short Stay at : 5:15 AM ? ? Call this number if you have problems the morning of surgery [phone] ? ? CLEAR LIQUIDS FROM 6:00 PM DAY BEFORE until: 4:30 AM  DAY OF SURGERY ? ?Water ?Black Coffee (sugar ok, NO MILK/CREAM OR CREAMERS)  ?Tea (sugar ok, NO MILK/CREAM OR CREAMERS) regular and decaf                             ?Plain Jell-O (NO RED)                                           ?Fruit ices (not with fruit pulp, NO RED)                                     ?Popsicles (NO RED)                                                                  ?Juice: apple, WHITE grape, WHITE cranberry ?Sports drinks like Gatorade (NO RED) ?Clear broth(vegetable,chicken,beef)  ?Drink GATORADE drink AT: 4:30 AM the day of surgery.    ?  ?The day of surgery:  ?Drink ONE (1) Pre-Surgery Clear Ensure or G2 at AM the morning of surgery. Drink in one sitting. Do not sip.  ?This drink was given to you during your hospital  ?pre-op appointment visit. ?Nothing else to drink after completing the  ?Pre-Surgery Clear Ensure or G2. ?  ?       If you have questions, please contact your surgeon?s office. ? ?MORNING OF SURGERY DRINK:   DRINK 1 G2 drink BEFORE YOU  LEAVE HOME, DRINK ALL OF THE  G2 DRINK AT ONE TIME.   ?NO SOLID FOOD AFTER 600 PM THE NIGHT BEFORE YOUR SURGERY. YOU MAY DRINK CLEAR FLUIDS. THE G2 DRINK YOU DRINK BEFORE YOU LEAVE HOME WILL BE THE LAST FLUIDS YOU DRINK BEFORE SURGERY. ? ?PAIN IS EXPECTED AFTER SURGERY AND WILL NOT BE COMPLETELY ELIMINATED. AMBULATION AND TYLENOL WILL HELP REDUCE INCISIONAL AND GAS PAIN. MOVEMENT IS KEY! ? ?YOU ARE EXPECTED TO BE OUT OF BED WITHIN 4 HOURS  OF ADMISSION TO YOUR PATIENT ROOM. ? ?SITTING IN THE RECLINER THROUGHOUT THE DAY IS IMPORTANT FOR DRINKING FLUIDS AND MOVING GAS THROUGHOUT THE GI TRACT. ? ?[facility] ST[facility] SH[facility] UNLESS YOU ARE WALKING.  ? ?INCENTIVE SPIROMETER SHOULD BE USED EVERY HOUR WHILE AWAKE TO DECREASE POST-OPERATIVE COMPLICATIONS SUCH AS PNEUMONIA. ? ?WHEN DISCHARGED HOME, IT IS IMPORTANT TO CONTINUE TO WALK EVERY HOUR AND USE THE INCENTIVE SPIROMETER EVERY HOUR. ?  ?Oral Hygiene is also important to reduce your risk of infection.                                    ?Remember - BRUSH YOUR TEETH THE MORNING OF SURGERY WITH YOUR REGULAR TOOTHPASTE ? ? Do NOT smoke after Midnight ? ? Take these medicines the morning of surgery with A SIP OF WATER: Tylenol as needed. ? ?DO NOT TAKE ANY ORAL DIABETIC MEDICATIONS DAY OF YOUR SURGERY ? ?Bring CPAP mask and tubing day of surgery. ?                  ?           You may not have any metal on your body including hair pins, jewelry, and body piercing ? ?           Do not wear lotions, powders, perfumes/cologne, or deodorant ? ?            Men may shave face and neck. ? ? Do not bring valuables to the hospital. [facility] IS NOT ?            RESPONSIBLE   FOR VALUABLES. ? ? Contacts, dentures or bridgework may not be worn into surgery. ? ? Bring small overnight bag day of surgery. ?  ? Patients discharged on the day of surgery will not be allowed to drive home.  Someone NEEDS to stay with you for the first 24 hours after  anesthesia. ? ? Special Instructions: Bring a copy of your healthcare power of attorney and living will documents         the day of surgery if you haven't scanned them before. ? ?            Please read over the following fact sheets you were given: IF YOU HAVE QUESTIONS ABOUT YOUR PRE-OP INSTRUCTIONS PLEASE CALL [phone] ? ?   [facility] - Preparing for Surgery ?Before surgery, you can play an important role.  Because skin is not sterile, your skin needs to be as free of germs as possible.  You can reduce the number of germs on your skin by washing with CHG (chlorahexidine gluconate) soap before surgery.  CHG is an antiseptic cleaner which kills germs and bonds with the skin to continue killing germs even after washing. ?Please DO NOT use if you have an allergy to CHG or antibacterial soaps.  If your skin becomes reddened/irritated stop using the CHG and inform your nurse when you arrive at Short Stay. ?Do not shave (including legs and underarms) for at least 48 hours prior to the first CHG shower.  You may shave your face/neck. ?Please follow these instructions carefully: ? 1.  Shower with CHG Soap the night before surgery and the  morning of Surgery. ? 2.  If you choose to wash your hair, wash your hair first as usual with your  normal  shampoo. ? 3.  After you shampoo, rinse your hair and body thoroughly to remove the  shampoo.                           4.  Use CHG as you would any other liquid soap.  You can apply chg directly  to the skin and wash  ?                     Gently with a scrungie or clean washcloth. ? 5.  Apply the CHG Soap to your body ONLY FROM THE NECK DOWN.   Do not use on face/ open      ?                     Wound or open sores. Avoid contact with eyes, ears mouth and genitals (private parts).  ?                     Engineering geologist,  Genitals (private parts) with your normal soap. ?            6.  Wash thoroughly, paying special attention to the area where your surgery  will be  performed. ? 7.  Thoroughly rinse your body with warm water from the neck down. ? 8.  DO NOT shower/wash with your normal soap after using and rinsing off  the CHG Soap. ?               9.  Pat yourself dry with a clean towel. ?           10.  Wear clean pajamas. ?           11.  Place clean sheets on your bed the night of your first shower and do not  sleep with pets. ?Day of Surgery : ?Do not apply any lotions/deodorants the morning of surgery.  Please wear clean clothes to the hospital/surgery center. ? ?FAILURE TO FOLLOW THESE INSTRUCTIONS MAY RESULT IN THE CANCELLATION OF YOUR SURGERY ?PATIENT SIGNATURE_________________________________ ? ?NURSE SIGNATURE__________________________________ ? ?________________________________________________________________________  ?

## 2021-08-26 NOTE — Telephone Encounter (Signed)
Patient is scheduled with Buelah Manis, NP on 09/23/2021 at 9am. Nothing further needed. ?

## 2021-08-27 ENCOUNTER — Encounter (HOSPITAL_COMMUNITY)
Admission: RE | Admit: 2021-08-27 | Discharge: 2021-08-27 | Disposition: A | Discharge status: Home / Self Care | Payer: 59 | Source: Ambulatory Visit | Attending: Surgery | Admitting: Surgery

## 2021-08-27 ENCOUNTER — Encounter (HOSPITAL_COMMUNITY): Payer: Self-pay

## 2021-08-27 ENCOUNTER — Other Ambulatory Visit: Payer: Self-pay

## 2021-08-27 DIAGNOSIS — Z01812 Encounter for preprocedural laboratory examination: Secondary | ICD-10-CM | POA: Diagnosis present

## 2021-08-27 DIAGNOSIS — E662 Morbid (severe) obesity with alveolar hypoventilation: Secondary | ICD-10-CM | POA: Insufficient documentation

## 2021-08-27 LAB — COMPREHENSIVE METABOLIC PANEL
ALT: 62 U/L — ABNORMAL HIGH (ref 0–44)
AST: 38 U/L (ref 15–41)
Albumin: 4 g/dL (ref 3.5–5.0)
Alkaline Phosphatase: 76 U/L (ref 38–126)
Anion gap: 8 (ref 5–15)
BUN: 14 mg/dL (ref 6–20)
CO2: 23 mmol/L (ref 22–32)
Calcium: 9.2 mg/dL (ref 8.9–10.3)
Chloride: 105 mmol/L (ref 98–111)
Creatinine, Ser: 0.76 mg/dL (ref 0.61–1.24)
GFR, Estimated: >60 mL/min (ref >60)
Glucose, Bld: 107 mg/dL — ABNORMAL HIGH (ref 70–99)
Potassium: 3.7 mmol/L (ref 3.5–5.1)
Sodium: 136 mmol/L (ref 135–145)
Total Bilirubin: 0.8 mg/dL (ref 0.3–1.2)
Total Protein: 7.9 g/dL (ref 6.5–8.1)

## 2021-08-27 LAB — CBC WITH DIFFERENTIAL/PLATELET
Abs Immature Granulocytes: 0.03 10*3/uL (ref 0.00–0.07)
Basophils Absolute: 0 10*3/uL (ref 0.0–0.1)
Basophils Relative: 0 %
Eosinophils Absolute: 0.1 10*3/uL (ref 0.0–0.5)
Eosinophils Relative: 1 %
HCT: 44.6 % (ref 39.0–52.0)
Hemoglobin: 15.1 g/dL (ref 13.0–17.0)
Immature Granulocytes: 0 %
Lymphocytes Relative: 30 %
Lymphs Abs: 2.1 10*3/uL (ref 0.7–4.0)
MCH: 30.9 pg (ref 26.0–34.0)
MCHC: 33.9 g/dL (ref 30.0–36.0)
MCV: 91.4 fL (ref 80.0–100.0)
Monocytes Absolute: 0.6 10*3/uL (ref 0.1–1.0)
Monocytes Relative: 9 %
Neutro Abs: 4 10*3/uL (ref 1.7–7.7)
Neutrophils Relative %: 60 %
Platelets: 237 10*3/uL (ref 150–400)
RBC: 4.88 MIL/uL (ref 4.22–5.81)
RDW: 12.5 % (ref 11.5–15.5)
WBC: 6.8 10*3/uL (ref 4.0–10.5)
nRBC: 0 % (ref 0.0–0.2)

## 2021-08-27 LAB — TYPE AND SCREEN
ABO/RH(D): B POS
Antibody Screen: NEGATIVE

## 2021-08-27 NOTE — Progress Notes (Signed)
For Short Stay:  ?COVID SWAB appointment date: N/A ?Date of COVID positive in last 90 days: N/A ?COVID Vaccine: NO ?Bowel Prep reminder: N/A ? ? ?For Anesthesia: ?PCP - Della Goo: FNP ?Cardiologist -  ? ?Chest x-ray - 06/04/21 ?EKG - 06/04/21 ?Stress Test -  ?ECHO -  ?Cardiac Cath -  ?Pacemaker/ICD device last checked: ?Pacemaker orders received: ?Device Rep notified: ? ?Spinal Cord Stimulator: ? ?Sleep Study - Yes ?CPAP - Yes ? ?Fasting Blood Sugar -  ?Checks Blood Sugar _____ times a day ?Date and result of last Hgb A1c- ? ?Blood Thinner Instructions: ?Aspirin Instructions: ?Last Dose: ? ?Activity level: Can go up a flight of stairs and activities of daily living without stopping and without chest pain and/or shortness of breath ?  Able to exercise without chest pain and/or shortness of breath ?  Unable to go up a flight of stairs without chest pain and/or shortness of breath ?   ? ?Anesthesia review: Hx: OSA(CPAP) ? ?Patient denies shortness of breath, fever, cough and chest pain at PAT appointment ? ? ?Patient verbalized understanding of instructions that were given to them at the PAT appointment. Patient was also instructed that they will need to review over the PAT instructions again at home before surgery.  ?  ?

## 2021-09-08 NOTE — H&P (Signed)
PROVIDER: Katha Cabal, MD ? ?MRN: [id number] ?DOB: Nov 07, 1980 ? ?Chief Complaint: Pre-op Exam Nicholas Haney) ? ? ?History of Present Illness: ?Nicholas Haney is a 41 y.o. male who is seen today as an office consultation at the request of Dr. Seymour Bars for evaluation of Pre-op Exam Nicholas Haney) ?.  ? ?Nicholas Haney going to be fitted for his CPAP mask today because he has pretty severe obstructive sleep apnea. His upper GI I reviewed and he has no hiatal hernia but some GERD was seen. I went over the procedure with him again and discussed laparoscopic and robotic sleeve gastrectomy's. He seems to understand how the procedure is done. He is waiting for a date. ? ?Review of Systems: ?See HPI as well for other ROS. ? ?ROS  ? ?Medical History: ?Past Medical History:  ?Diagnosis Date  ? Herniated disc  ? ?Patient Active Problem List  ?Diagnosis  ? Anxiety  ? Depressive disorder  ? Dermatophytosis of foot  ? History of 2019 novel coronavirus disease (COVID-19)  ? History of tobacco use  ? Morbid obesity with BMI of 40.0-44.9, adult (CMS-HCC)  ? Pilonidal cyst without abscess  ? PTSD (post-traumatic stress disorder)  ? Alcohol dependence (CMS-HCC)  ? Cannabis dependence (CMS-HCC)  ? Chronic pain  ? ?Past Surgical History:  ?Procedure Laterality Date  ? LAPAROSCOPIC CHOLECYSTECTOMY N/A  ? ? ?No Known Allergies ? ?No current outpatient medications on file prior to visit.  ? ?No current facility-administered medications on file prior to visit.  ? ?Family History  ?Problem Relation Age of Onset  ? No Known Problems Mother  ? No Known Problems Father  ? ? ?Social History  ? ?Tobacco Use  ?Smoking Status Former  ? Types: Cigarettes  ? Quit date: 2020  ? Years since quitting: 3.1  ?Smokeless Tobacco Never  ? ? ?Social History  ? ?Socioeconomic History  ? Marital status: Married  ?Tobacco Use  ? Smoking status: Former  ?Types: Cigarettes  ?Quit date: 2020  ?Years since quitting: 3.1  ? Smokeless tobacco: Never  ?Vaping Use  ? Vaping Use:  Never used  ?Substance and Sexual Activity  ? Alcohol use: Not Currently  ? Drug use: No  ? ?Objective:  ? ?Vitals:  ?Weight: (!) 130 kg (286 lb 9.6 oz)  ?Height: 177.8 cm (5\' 10" )  ? ?Body mass index is 41.12 kg/m?. ? ?Physical Exam ?General: Broad chested normally developed man in no acute distress ?HEENT : Unremarkable ?Chest: Clear ?Heart: Sinus rhythm ?Breast: Not examined ?Abdomen: Nontender and prior lap chole ?GU not examined ?Rectal not performed ?Extremities full range of motion ?Neuro alert and oriented x3. Motor and sensory function grossly intact. ? ?Labs, Imaging and Diagnostic Testing: ?Upper GI series report reviewed. ? ?Assessment and Plan:  ?Diagnoses and all orders for this visit: ? ?Morbid (severe) obesity due to excess calories (CMS-HCC) ? ? ? ?He seems to be well apprised of robotic sleeve gastrectomy. ? ? ?Zharia Conrow , MD  ? ?

## 2021-09-09 ENCOUNTER — Encounter (HOSPITAL_COMMUNITY)
Admission: RE | Disposition: A | Discharge status: Home / Self Care | Payer: Self-pay | Source: Home / Self Care | Attending: Surgery

## 2021-09-09 ENCOUNTER — Inpatient Hospital Stay (HOSPITAL_COMMUNITY): Payer: 59 | Admitting: Certified Registered Nurse Anesthetist

## 2021-09-09 ENCOUNTER — Other Ambulatory Visit: Payer: Self-pay

## 2021-09-09 ENCOUNTER — Inpatient Hospital Stay (HOSPITAL_COMMUNITY)
Admission: RE | Admit: 2021-09-09 | Discharge: 2021-09-10 | DRG: 621 | Disposition: A | Discharge status: Home / Self Care | Payer: 59 | Attending: Surgery | Admitting: Surgery

## 2021-09-09 ENCOUNTER — Encounter (HOSPITAL_COMMUNITY): Payer: Self-pay | Admitting: Surgery

## 2021-09-09 DIAGNOSIS — Z6839 Body mass index (BMI) 39.0-39.9, adult: Secondary | ICD-10-CM | POA: Diagnosis not present

## 2021-09-09 DIAGNOSIS — Z6841 Body Mass Index (BMI) 40.0 and over, adult: Secondary | ICD-10-CM | POA: Diagnosis not present

## 2021-09-09 DIAGNOSIS — G8929 Other chronic pain: Secondary | ICD-10-CM | POA: Diagnosis present

## 2021-09-09 DIAGNOSIS — K219 Gastro-esophageal reflux disease without esophagitis: Secondary | ICD-10-CM | POA: Diagnosis present

## 2021-09-09 DIAGNOSIS — Z8616 Personal history of COVID-19: Secondary | ICD-10-CM | POA: Diagnosis not present

## 2021-09-09 DIAGNOSIS — B353 Tinea pedis: Secondary | ICD-10-CM | POA: Diagnosis present

## 2021-09-09 DIAGNOSIS — Z9884 Bariatric surgery status: Principal | ICD-10-CM

## 2021-09-09 DIAGNOSIS — Z87891 Personal history of nicotine dependence: Secondary | ICD-10-CM | POA: Diagnosis not present

## 2021-09-09 DIAGNOSIS — F1220 Cannabis dependence, uncomplicated: Secondary | ICD-10-CM | POA: Diagnosis present

## 2021-09-09 DIAGNOSIS — G4733 Obstructive sleep apnea (adult) (pediatric): Secondary | ICD-10-CM | POA: Diagnosis present

## 2021-09-09 DIAGNOSIS — F1020 Alcohol dependence, uncomplicated: Secondary | ICD-10-CM | POA: Diagnosis present

## 2021-09-09 DIAGNOSIS — F4310 Post-traumatic stress disorder, unspecified: Secondary | ICD-10-CM | POA: Diagnosis present

## 2021-09-09 DIAGNOSIS — E662 Morbid (severe) obesity with alveolar hypoventilation: Secondary | ICD-10-CM

## 2021-09-09 HISTORY — PX: UPPER GI ENDOSCOPY: SHX6162

## 2021-09-09 LAB — CBC
HCT: 42.4 % (ref 39.0–52.0)
Hemoglobin: 14.5 g/dL (ref 13.0–17.0)
MCH: 31.5 pg (ref 26.0–34.0)
MCHC: 34.2 g/dL (ref 30.0–36.0)
MCV: 92.2 fL (ref 80.0–100.0)
Platelets: 216 10*3/uL (ref 150–400)
RBC: 4.6 MIL/uL (ref 4.22–5.81)
RDW: 12.5 % (ref 11.5–15.5)
WBC: 10.3 10*3/uL (ref 4.0–10.5)
nRBC: 0 % (ref 0.0–0.2)

## 2021-09-09 LAB — HEMOGLOBIN AND HEMATOCRIT, BLOOD
HCT: 42.3 % (ref 39.0–52.0)
Hemoglobin: 14.8 g/dL (ref 13.0–17.0)

## 2021-09-09 LAB — CREATININE, SERUM
Creatinine, Ser: 1.05 mg/dL (ref 0.61–1.24)
GFR, Estimated: >60 mL/min (ref >60)

## 2021-09-09 LAB — ABO/RH: ABO/RH(D): B POS

## 2021-09-09 SURGERY — GASTRECTOMY, SLEEVE, ROBOT-ASSISTED
Anesthesia: General | Site: Mouth

## 2021-09-09 MED ORDER — LACTATED RINGERS IV SOLN: INTRAVENOUS | Status: DC

## 2021-09-09 MED ORDER — ACETAMINOPHEN 500 MG PO TABS
1000.0000 mg | ORAL_TABLET | Freq: Three times a day (TID) | ORAL | Status: DC
  Administered 2021-09-09 (×2): 1000 mg via ORAL
  Filled 2021-09-09 (×3): qty 2

## 2021-09-09 MED ORDER — CHLORHEXIDINE GLUCONATE CLOTH 2 % EX PADS: 6.0000 | MEDICATED_PAD | Freq: Once | CUTANEOUS | Status: DC

## 2021-09-09 MED ORDER — DEXAMETHASONE SODIUM PHOSPHATE 10 MG/ML IJ SOLN
INTRAMUSCULAR | Status: DC | PRN
  Administered 2021-09-09: 10 mg via INTRAVENOUS

## 2021-09-09 MED ORDER — ONDANSETRON HCL 4 MG/2ML IJ SOLN: 4.0000 mg | INTRAMUSCULAR | Status: DC | PRN

## 2021-09-09 MED ORDER — DEXAMETHASONE SODIUM PHOSPHATE 10 MG/ML IJ SOLN
INTRAMUSCULAR | Status: AC
  Filled 2021-09-09: qty 1

## 2021-09-09 MED ORDER — SCOPOLAMINE 1 MG/3DAYS TD PT72
1.0000 | MEDICATED_PATCH | TRANSDERMAL | Status: DC
  Administered 2021-09-09: 1.5 mg via TRANSDERMAL
  Filled 2021-09-09: qty 1

## 2021-09-09 MED ORDER — KETOROLAC TROMETHAMINE 30 MG/ML IJ SOLN: 30.0000 mg | Freq: Once | INTRAMUSCULAR | Status: DC | PRN

## 2021-09-09 MED ORDER — HEPARIN SODIUM (PORCINE) 5000 UNIT/ML IJ SOLN
5000.0000 [IU] | Freq: Three times a day (TID) | INTRAMUSCULAR | Status: DC
  Administered 2021-09-09 – 2021-09-10 (×3): 5000 [IU] via SUBCUTANEOUS
  Filled 2021-09-09 (×3): qty 1

## 2021-09-09 MED ORDER — METOPROLOL TARTRATE 5 MG/5ML IV SOLN: 5.0000 mg | Freq: Four times a day (QID) | INTRAVENOUS | Status: DC | PRN

## 2021-09-09 MED ORDER — FENTANYL CITRATE PF 50 MCG/ML IJ SOSY
25.0000 ug | PREFILLED_SYRINGE | INTRAMUSCULAR | Status: DC | PRN
  Administered 2021-09-09 (×2): 50 ug via INTRAVENOUS

## 2021-09-09 MED ORDER — KCL IN DEXTROSE-NACL 20-5-0.45 MEQ/L-%-% IV SOLN
INTRAVENOUS | Status: DC
  Administered 2021-09-09: 1000 mL via INTRAVENOUS
  Filled 2021-09-09: qty 1000

## 2021-09-09 MED ORDER — PANTOPRAZOLE SODIUM 40 MG IV SOLR
40.0000 mg | Freq: Every day | INTRAVENOUS | Status: DC
  Administered 2021-09-09: 40 mg via INTRAVENOUS
  Filled 2021-09-09: qty 10

## 2021-09-09 MED ORDER — BUPIVACAINE LIPOSOME 1.3 % IJ SUSP: 20.0000 mL | Freq: Once | INTRAMUSCULAR | Status: DC

## 2021-09-09 MED ORDER — ROCURONIUM BROMIDE 10 MG/ML (PF) SYRINGE
PREFILLED_SYRINGE | INTRAVENOUS | Status: AC
  Filled 2021-09-09: qty 10

## 2021-09-09 MED ORDER — MIDAZOLAM HCL 2 MG/2ML IJ SOLN
INTRAMUSCULAR | Status: AC
  Filled 2021-09-09: qty 2

## 2021-09-09 MED ORDER — OXYCODONE HCL 5 MG PO TABS: 5.0000 mg | ORAL_TABLET | Freq: Once | ORAL | Status: DC | PRN

## 2021-09-09 MED ORDER — ACETAMINOPHEN 160 MG/5ML PO SOLN: 1000.0000 mg | Freq: Three times a day (TID) | ORAL | Status: DC

## 2021-09-09 MED ORDER — SODIUM CHLORIDE (PF) 0.9 % IJ SOLN
INTRAMUSCULAR | Status: DC | PRN
  Administered 2021-09-09: 10 mL

## 2021-09-09 MED ORDER — LACTATED RINGERS IR SOLN
Status: DC | PRN
  Administered 2021-09-09: 1000 mL

## 2021-09-09 MED ORDER — SUGAMMADEX SODIUM 500 MG/5ML IV SOLN
INTRAVENOUS | Status: DC | PRN
  Administered 2021-09-09: 400 mg via INTRAVENOUS

## 2021-09-09 MED ORDER — BUPIVACAINE LIPOSOME 1.3 % IJ SUSP
INTRAMUSCULAR | Status: AC
  Filled 2021-09-09: qty 20

## 2021-09-09 MED ORDER — FENTANYL CITRATE PF 50 MCG/ML IJ SOSY
PREFILLED_SYRINGE | INTRAMUSCULAR | Status: AC
  Filled 2021-09-09: qty 1

## 2021-09-09 MED ORDER — ONDANSETRON HCL 4 MG/2ML IJ SOLN
INTRAMUSCULAR | Status: DC | PRN
  Administered 2021-09-09: 4 mg via INTRAVENOUS

## 2021-09-09 MED ORDER — LIDOCAINE 2% (20 MG/ML) 5 ML SYRINGE
INTRAMUSCULAR | Status: DC | PRN
Start: 2021-09-09 — End: 2021-09-09
  Administered 2021-09-09: 1.5 mg/kg/h via INTRAVENOUS

## 2021-09-09 MED ORDER — FENTANYL CITRATE (PF) 250 MCG/5ML IJ SOLN
INTRAMUSCULAR | Status: DC | PRN
  Administered 2021-09-09: 50 ug via INTRAVENOUS
  Administered 2021-09-09: 100 ug via INTRAVENOUS
  Administered 2021-09-09 (×2): 50 ug via INTRAVENOUS

## 2021-09-09 MED ORDER — FENTANYL CITRATE (PF) 250 MCG/5ML IJ SOLN
INTRAMUSCULAR | Status: AC
  Filled 2021-09-09: qty 5

## 2021-09-09 MED ORDER — KCL IN DEXTROSE-NACL 20-5-0.45 MEQ/L-%-% IV SOLN
INTRAVENOUS | Status: AC
  Filled 2021-09-09: qty 1000

## 2021-09-09 MED ORDER — LIDOCAINE HCL 2 % IJ SOLN
INTRAMUSCULAR | Status: AC
  Filled 2021-09-09: qty 20

## 2021-09-09 MED ORDER — ONDANSETRON HCL 4 MG/2ML IJ SOLN
INTRAMUSCULAR | Status: AC
  Filled 2021-09-09: qty 2

## 2021-09-09 MED ORDER — FENTANYL CITRATE PF 50 MCG/ML IJ SOSY
PREFILLED_SYRINGE | INTRAMUSCULAR | Status: AC
  Administered 2021-09-09: 50 ug via INTRAVENOUS
  Filled 2021-09-09: qty 2

## 2021-09-09 MED ORDER — MORPHINE SULFATE (PF) 2 MG/ML IV SOLN: 1.0000 mg | INTRAVENOUS | Status: DC | PRN

## 2021-09-09 MED ORDER — ENSURE MAX PROTEIN PO LIQD
2.0000 [oz_av] | ORAL | Status: DC
  Administered 2021-09-10 (×2): 2 [oz_av] via ORAL

## 2021-09-09 MED ORDER — BUPIVACAINE LIPOSOME 1.3 % IJ SUSP
INTRAMUSCULAR | Status: DC | PRN
  Administered 2021-09-09: 20 mL

## 2021-09-09 MED ORDER — DEXMEDETOMIDINE (PRECEDEX) IN NS 20 MCG/5ML (4 MCG/ML) IV SYRINGE
PREFILLED_SYRINGE | INTRAVENOUS | Status: DC | PRN
  Administered 2021-09-09: 8 ug via INTRAVENOUS
  Administered 2021-09-09: 12 ug via INTRAVENOUS

## 2021-09-09 MED ORDER — ACETAMINOPHEN 500 MG PO TABS
1000.0000 mg | ORAL_TABLET | ORAL | Status: AC
  Administered 2021-09-09: 1000 mg via ORAL
  Filled 2021-09-09: qty 2

## 2021-09-09 MED ORDER — SODIUM CHLORIDE 0.9 % IV SOLN
2.0000 g | INTRAVENOUS | Status: AC
  Administered 2021-09-09: 2 g via INTRAVENOUS
  Filled 2021-09-09: qty 2

## 2021-09-09 MED ORDER — PROPOFOL 10 MG/ML IV BOLUS
INTRAVENOUS | Status: DC | PRN
  Administered 2021-09-09: 250 mg via INTRAVENOUS

## 2021-09-09 MED ORDER — APREPITANT 40 MG PO CAPS
40.0000 mg | ORAL_CAPSULE | ORAL | Status: AC
  Administered 2021-09-09: 40 mg via ORAL
  Filled 2021-09-09: qty 1

## 2021-09-09 MED ORDER — MIDAZOLAM HCL 5 MG/5ML IJ SOLN
INTRAMUSCULAR | Status: DC | PRN
Start: 2021-09-09 — End: 2021-09-09
  Administered 2021-09-09: 2 mg via INTRAVENOUS

## 2021-09-09 MED ORDER — KETAMINE HCL 10 MG/ML IJ SOLN
INTRAMUSCULAR | Status: DC | PRN
  Administered 2021-09-09: 50 mg via INTRAVENOUS

## 2021-09-09 MED ORDER — DEXMEDETOMIDINE (PRECEDEX) IN NS 20 MCG/5ML (4 MCG/ML) IV SYRINGE
PREFILLED_SYRINGE | INTRAVENOUS | Status: AC
  Filled 2021-09-09: qty 5

## 2021-09-09 MED ORDER — OXYCODONE HCL 5 MG/5ML PO SOLN
5.0000 mg | Freq: Four times a day (QID) | ORAL | Status: DC | PRN
  Administered 2021-09-09: 5 mg via ORAL
  Filled 2021-09-09: qty 5

## 2021-09-09 MED ORDER — SUGAMMADEX SODIUM 500 MG/5ML IV SOLN
INTRAVENOUS | Status: AC
  Filled 2021-09-09: qty 5

## 2021-09-09 MED ORDER — KETAMINE HCL 50 MG/5ML IJ SOSY
PREFILLED_SYRINGE | INTRAMUSCULAR | Status: AC
  Filled 2021-09-09: qty 5

## 2021-09-09 MED ORDER — ONDANSETRON HCL 4 MG/2ML IJ SOLN: 4.0000 mg | Freq: Once | INTRAMUSCULAR | Status: DC | PRN

## 2021-09-09 MED ORDER — ROCURONIUM BROMIDE 10 MG/ML (PF) SYRINGE
PREFILLED_SYRINGE | INTRAVENOUS | Status: DC | PRN
  Administered 2021-09-09: 30 mg via INTRAVENOUS
  Administered 2021-09-09: 10 mg via INTRAVENOUS
  Administered 2021-09-09: 80 mg via INTRAVENOUS
  Administered 2021-09-09: 20 mg via INTRAVENOUS

## 2021-09-09 MED ORDER — CHLORHEXIDINE GLUCONATE 0.12 % MT SOLN
15.0000 mL | Freq: Once | OROMUCOSAL | Status: AC
  Administered 2021-09-09: 15 mL via OROMUCOSAL

## 2021-09-09 MED ORDER — OXYCODONE HCL 5 MG/5ML PO SOLN: 5.0000 mg | Freq: Once | ORAL | Status: DC | PRN

## 2021-09-09 MED ORDER — ORAL CARE MOUTH RINSE
15.0000 mL | Freq: Once | OROMUCOSAL | Status: AC
Start: 2021-09-09 — End: 2021-09-09

## 2021-09-09 MED ORDER — PROPOFOL 10 MG/ML IV BOLUS
INTRAVENOUS | Status: AC
  Filled 2021-09-09: qty 20

## 2021-09-09 MED ORDER — SODIUM CHLORIDE 0.9 % IR SOLN
Status: DC | PRN
  Administered 2021-09-09: 1000 mL

## 2021-09-09 MED ORDER — SODIUM CHLORIDE (PF) 0.9 % IJ SOLN
INTRAMUSCULAR | Status: AC
  Filled 2021-09-09: qty 10

## 2021-09-09 MED ORDER — HEPARIN SODIUM (PORCINE) 5000 UNIT/ML IJ SOLN
5000.0000 [IU] | INTRAMUSCULAR | Status: AC
  Administered 2021-09-09: 5000 [IU] via SUBCUTANEOUS
  Filled 2021-09-09: qty 1

## 2021-09-09 MED ORDER — LIDOCAINE 2% (20 MG/ML) 5 ML SYRINGE
INTRAMUSCULAR | Status: DC | PRN
  Administered 2021-09-09: 100 mg via INTRAVENOUS

## 2021-09-09 SURGICAL SUPPLY — 72 items
APPLIER CLIP 5 13 M/L LIGAMAX5 (MISCELLANEOUS) ×3
APPLIER CLIP ROT 10 11.4 M/L (STAPLE)
BLADE SURG 15 STRL LF DISP TIS (BLADE) ×2 IMPLANT
BLADE SURG 15 STRL SS (BLADE) ×1
CANNULA REDUC XI 12-8 STAPL (CANNULA) ×1
CANNULA REDUCER 12-8 DVNC XI (CANNULA) ×2 IMPLANT
CHLORAPREP W/TINT 26 (MISCELLANEOUS) ×3 IMPLANT
CLIP APPLIE 5 13 M/L LIGAMAX5 (MISCELLANEOUS) IMPLANT
CLIP APPLIE ROT 10 11.4 M/L (STAPLE) IMPLANT
COVER SURGICAL LIGHT HANDLE (MISCELLANEOUS) ×3 IMPLANT
DERMABOND ADVANCED (GAUZE/BANDAGES/DRESSINGS) ×1
DERMABOND ADVANCED .7 DNX12 (GAUZE/BANDAGES/DRESSINGS) ×2 IMPLANT
DRAPE ARM DVNC X/XI (DISPOSABLE) ×8 IMPLANT
DRAPE COLUMN DVNC XI (DISPOSABLE) ×2 IMPLANT
DRAPE DA VINCI XI ARM (DISPOSABLE) ×4
DRAPE DA VINCI XI COLUMN (DISPOSABLE) ×1
ELECT REM PT RETURN 15FT ADLT (MISCELLANEOUS) ×3 IMPLANT
GLOVE SURG ENC MOIS LTX SZ6.5 (GLOVE) ×2 IMPLANT
GLOVE SURG ENC MOIS LTX SZ7 (GLOVE) ×2 IMPLANT
GLOVE SURG ENC MOIS LTX SZ8 (GLOVE) ×6 IMPLANT
GLOVE SURG UNDER POLY LF SZ6.5 (GLOVE) ×2 IMPLANT
GOWN STRL REUS W/ TWL XL LVL3 (GOWN DISPOSABLE) ×6 IMPLANT
GOWN STRL REUS W/TWL XL LVL3 (GOWN DISPOSABLE) ×3
GRASPER SUT TROCAR 14GX15 (MISCELLANEOUS) ×3 IMPLANT
IRRIG SUCT STRYKERFLOW 2 WTIP (MISCELLANEOUS) ×3
IRRIGATION SUCT STRKRFLW 2 WTP (MISCELLANEOUS) ×2 IMPLANT
KIT BASIN OR (CUSTOM PROCEDURE TRAY) ×3 IMPLANT
KIT TURNOVER KIT A (KITS) ×1 IMPLANT
LUBRICANT JELLY K Y 4OZ (MISCELLANEOUS) IMPLANT
MARKER SKIN DUAL TIP RULER LAB (MISCELLANEOUS) ×3 IMPLANT
MAT PREVALON FULL STRYKER (MISCELLANEOUS) ×3 IMPLANT
NDL SPNL 22GX3.5 QUINCKE BK (NEEDLE) ×2 IMPLANT
NEEDLE SPNL 22GX3.5 QUINCKE BK (NEEDLE) ×3 IMPLANT
OBTURATOR OPTICAL STANDARD 8MM (TROCAR) ×1
OBTURATOR OPTICAL STND 8 DVNC (TROCAR) ×2
OBTURATOR OPTICALSTD 8 DVNC (TROCAR) ×2 IMPLANT
PACK CARDIOVASCULAR III (CUSTOM PROCEDURE TRAY) ×3 IMPLANT
RELOAD STAPLE 60 2.5 WHT DVNC (STAPLE) IMPLANT
RELOAD STAPLE 60 3.5 BLU DVNC (STAPLE) IMPLANT
RELOAD STAPLER 2.5X60 WHT DVNC (STAPLE) ×12 IMPLANT
RELOAD STAPLER 3.5X60 BLU DVNC (STAPLE) ×2 IMPLANT
SCISSORS LAP 5X35 DISP (ENDOMECHANICALS) IMPLANT
SEAL CANN UNIV 5-8 DVNC XI (MISCELLANEOUS) ×6 IMPLANT
SEAL XI 5MM-8MM UNIVERSAL (MISCELLANEOUS) ×3
SEALER VESSEL DA VINCI XI (MISCELLANEOUS) ×1
SEALER VESSEL EXT DVNC XI (MISCELLANEOUS) ×2 IMPLANT
SLEEVE GASTRECTOMY 36FR VISIGI (MISCELLANEOUS) ×3 IMPLANT
SOL ANTI FOG 6CC (MISCELLANEOUS) ×2 IMPLANT
SOLUTION ANTI FOG 6CC (MISCELLANEOUS) ×1
SOLUTION ELECTROLUBE (MISCELLANEOUS) ×3 IMPLANT
SPIKE FLUID TRANSFER (MISCELLANEOUS) ×3 IMPLANT
SPONGE T-LAP 18X18 ~~LOC~~+RFID (SPONGE) ×3 IMPLANT
STAPLER 60 DA VINCI SURE FORM (STAPLE) ×1
STAPLER 60 SUREFORM DVNC (STAPLE) ×2 IMPLANT
STAPLER CANNULA SEAL DVNC XI (STAPLE) ×2 IMPLANT
STAPLER CANNULA SEAL XI (STAPLE) ×1
STAPLER RELOAD 2.5X60 WHITE (STAPLE) ×6
STAPLER RELOAD 2.5X60 WHT DVNC (STAPLE) ×12
STAPLER RELOAD 3.5X60 BLU DVNC (STAPLE) ×2
STAPLER RELOAD 3.5X60 BLUE (STAPLE) ×1
SUT ETHIBOND 0 36 GRN (SUTURE) IMPLANT
SUT MNCRL AB 4-0 PS2 18 (SUTURE) ×7 IMPLANT
SUT VICRYL 0 TIES 12 18 (SUTURE) ×3 IMPLANT
SYR 10ML ECCENTRIC (SYRINGE) IMPLANT
SYR 20ML LL LF (SYRINGE) ×3 IMPLANT
TOWEL OR 17X26 10 PK STRL BLUE (TOWEL DISPOSABLE) ×3 IMPLANT
TRAY FOLEY MTR SLVR 16FR STAT (SET/KITS/TRAYS/PACK) IMPLANT
TROCAR ADV FIXATION 5X100MM (TROCAR) IMPLANT
TROCAR BLADELESS OPT 5 100 (ENDOMECHANICALS) ×3 IMPLANT
TROCAR Z-THREAD FIOS 5X100MM (TROCAR) IMPLANT
TUBE CALIBRATION LAPBAND (TUBING) IMPLANT
TUBING INSUFFLATION 10FT LAP (TUBING) ×3 IMPLANT

## 2021-09-09 NOTE — Discharge Instructions (Signed)
GASTRIC BYPASS / SLEEVE  ?Home Care Instructions ? ?These instructions are to help you care for yourself when you go home. ? ?Call: If you have any problems. ?Call [phone] and ask for the surgeon on call ?If you have an emergency related to your surgery please use the ER at [facility].  ?Tell the ER staff that you are a new post-op gastric bypass or gastric sleeve patient ?  ?Signs and symptoms to report: Severe vomiting or nausea ?If you cannot handle clear liquids for longer than 1 day, call your surgeon  ?Abdominal pain which does not get better after taking your pain medication ?Fever greater than 100.4? F and chills ?Heart rate over 100 beats a minute ?Trouble breathing ?Chest pain ? Redness, swelling, drainage, or foul odor at incision (surgical) sites ? If your incisions open or pull apart ?Swelling or pain in calf (lower leg) ?Diarrhea (Loose bowel movements that happen often), frequent watery, uncontrolled bowel movements ?Constipation, (no bowel movements for 3 days) if this happens:  ?Take Milk of Magnesia, 2 tablespoons by mouth, 3 times a day for 2 days if needed ?Stop taking Milk of Magnesia once you have had a bowel movement ?Call your doctor if constipation continues ?Or ?Take Miralax  (instead of Milk of Magnesia) following the label instructions ?Stop taking Miralax once you have had a bowel movement ?Call your doctor if constipation continues ?Anything you think is ?abnormal for you? ?  ?Normal side effects after surgery: Unable to sleep at night or unable to concentrate ?Irritability ?Being tearful (crying) or depressed ?These are common complaints, possibly related to your anesthesia, stress of surgery and change in lifestyle, that usually go away a few weeks after surgery.  If these feelings continue, call your medical doctor.  ?Wound Care: You may have surgical glue, steri-strips, or staples over your incisions after surgery ?Surgical glue:  Looks like a clear film over your incisions  and will wear off a little at a time ?Steri-strips : Adhesive strips of tape over your incisions. You may notice a yellowish color on the skin under the steri-strips. This is used to make the   steri-strips stick better. Do not pull the steri-strips off - let them fall off ?Staples: Staples may be removed before you leave the hospital ?If you go home with staples, call [facility] at for an appointment with your surgeon?s nurse to have staples removed 10 days after surgery, [phone] ?Showering: You may shower two (2) days after your surgery unless your surgeon tells you differently ?Wash gently around incisions with warm soapy water, rinse well, and gently pat dry  ?If you have a drain (tube from your incision), you may need someone to hold this while you shower  ?No tub baths until staples are removed and incisions are healed   ?  ?Medications: Medications should be liquid or crushed if larger than the size of a dime ?Extended release pills (medication that releases a little bit at a time through the day) should not be crushed ?Depending on the size and number of medications you take, you may need to space (take a few throughout the day)/change the time you take your medications so that you do not over-fill your pouch (smaller stomach) ?Make sure you follow-up with your primary care physician to make medication changes needed during rapid weight loss and life-style changes ?If you have diabetes, follow up with the doctor that orders your diabetes medication(s) within one week after surgery and check   your blood sugar regularly. ?Do not drive while taking narcotics (pain medications) ?DO NOT take NSAID'S (Examples of NSAID's include ibuprofen, naproxen)  ?Diet:                    First 2 Weeks ? You will see the nutritionist about two (2) weeks after your surgery. The nutritionist will increase the types of foods you can eat if you are handling liquids well: ?If you have severe vomiting or nausea  and cannot handle clear liquids lasting longer than 1 day, call your surgeon  ?Protein Shake ?Drink at least 2 ounces of shake 5-6 times per day ?Each serving of protein shakes (usually 8 - 12 ounces) should have a minimum of:  ?15 grams of protein  ?And no more than 5 grams of carbohydrate  ?Goal for protein each day: ?Men = 80 grams per day ?Women = 60 grams per day ?Protein powder may be added to fluids such as non-fat milk or Lactaid milk or Soy milk (limit to 35 grams added protein powder per serving) ? ?Hydration ?Slowly increase the amount of water and other clear liquids as tolerated (See Acceptable Fluids) ?Slowly increase the amount of protein shake as tolerated  ? Sip fluids slowly and throughout the day ?May use sugar substitutes in small amounts (no more than 6 - 8 packets per day; i.e. Splenda) ? ?Fluid Goal ?The first goal is to drink at least 8 ounces of protein shake/drink per day (or as directed by the nutritionist);  See handout from pre-op Bariatric Education Class for examples of protein shake/drink.   ?Slowly increase the amount of protein shake you drink as tolerated ?You may find it easier to slowly sip shakes throughout the day ?It is important to get your proteins in first ?Your fluid goal is to drink 64 - 100 ounces of fluid daily ?It may take a few weeks to build up to this ?32 oz (or more) should be clear liquids  ?And  ?32 oz (or more) should be full liquids (see below for examples) ?Liquids should not contain sugar, caffeine, or carbonation ? ?Clear Liquids: ?Water or Sugar-free flavored water (i.e. Fruit H2O, Propel) ?Decaffeinated coffee or tea (sugar-free) ?Annais Crafts Lite, Wyler?s Lite, Minute Maid Lite ?Sugar-free Jell-O ?Bouillon or broth ?Sugar-free Popsicle:   *Less than 20 calories each; Limit 1 per day ? ?Full Liquids: ?Protein Shakes/Drinks + 2 choices per day of other full liquids ?Full liquids must be: ?No More Than 12 grams of Carbs per serving  ?No More Than 3 grams of Fat  per serving ?Strained low-fat cream soup ?Non-Fat milk ?Fat-free Lactaid Milk ?Sugar-free yogurt (Dannon Lite & Fit, Greek yogurt) ? ? ? ?  ?Vitamins and Minerals Start 1 day after surgery unless otherwise directed by your surgeon ?Bariatric Specific Complete Multivitamins ?Chewable Calcium Citrate with Vitamin D-3 ?(Example: 3 Chewable Calcium Plus 600 with Vitamin D-3) ?Take 500 mg three (3) times a day for a total of 1500 mg each day ?Do not take all 3 doses of calcium at one time as it may cause constipation, and you can only absorb 500 mg  at a time  ?Do not mix multivitamins containing iron with calcium supplements; take 2 hours apart ? ?Menstruating women and those at risk for anemia (a blood disease that causes weakness) may need extra iron ?Talk with your doctor to see if you need more iron ?If you need extra iron: Total daily Iron recommendation (including Vitamins) is 50 to 100   mg Iron/day ?Do not stop taking or change any vitamins or minerals until you talk to your nutritionist or surgeon ?Your nutritionist and/or surgeon must approve all vitamin and mineral supplements ?  ?Activity and Exercise: It is important to continue walking at home.  Limit your physical activity as instructed by your doctor.  During this time, use these guidelines: ?Do not lift anything greater than ten (10) pounds for at least two (2) weeks ?Do not go back to work or drive until your surgeon says you can ?You may have sex when you feel comfortable  ?It is VERY important for male patients to use a reliable birth control method; fertility often increases after surgery  ?Do not get pregnant for at least 18 months ?Start exercising as soon as your doctor tells you that you can ?Make sure your doctor approves any physical activity ?Start with a simple walking program ?Walk 5-15 minutes each day, 7 days per week.  ?Slowly increase until you are walking 30-45 minutes per day ?Consider joining our BELT program. [phone] or email  [email or web address] ?  ?Special Instructions Things to remember: ? ?Use your CPAP when sleeping if this applies to you, do not stop the use of CPAP unless directed by physician after a sleep study ?[place]

## 2021-09-09 NOTE — Anesthesia Procedure Notes (Signed)
Procedure Name: Intubation ?Date/Time: 09/09/2021 7:32 AM ?Performed by: Maxwell Caul, CRNA ?Pre-anesthesia Checklist: Patient identified, Emergency Drugs available, Suction available and Patient being monitored ?Patient Re-evaluated:Patient Re-evaluated prior to induction ?Oxygen Delivery Method: Circle system utilized ?Preoxygenation: Pre-oxygenation with 100% oxygen ?Induction Type: IV induction ?Ventilation: Mask ventilation without difficulty ?Laryngoscope Size: Mac and 4 ?Grade View: Grade II ?Tube type: Oral ?Tube size: 7.5 mm ?Number of attempts: 1 ?Airway Equipment and Method: Stylet ?Placement Confirmation: ETT inserted through vocal cords under direct vision, positive ETCO2 and breath sounds checked- equal and bilateral ?Secured at: 23 cm ?Tube secured with: Tape ?Dental Injury: Teeth and Oropharynx as per pre-operative assessment  ? ? ? ? ?

## 2021-09-09 NOTE — Op Note (Signed)
? ?  Surgeon: Wenda Low, MD, FACS ? ?Asst:  Phylliss Blakes, MD, FACS ?09/09/21 ?Anes:  General endotracheal ? ?Procedure: Robotic sleeve gastrectomy and upper endoscopy ? ?Diagnosis: Morbid obesity ? ?Complications: none ? ?EBL:   minimal cc ? ?Description of Procedure: ? The patient was take to OR 2 and given general anesthesia.  The abdomen was prepped with Chloroprep and draped sterilely.  A timeout was performed.  Access to the abdomen was achieved with a 5 mm Optiview without difficulty.. ? ?Following insufflation, the state of the abdomen was found to be free of adhesions.  The liver was reasonably small..  The ViSiGi 36Fr tube was inserted to deflate the stomach and was pulled back into the esophagus.  Four trocars were placed including a 12 mm for the robotic stapler.   ? ?The pylorus was identified and we measured approximately 6 cm cm back and marked the antrum.  At that point we began dissection to take down the greater curvature of the stomach using the vessel sealer.  This dissection was taken all the way up to the left crus.  Posterior attachments of the stomach were also taken down.   ? ?The ViSiGi tube was then passed into the antrum and suction applied so that it was snug along the lessor curvature.  The "crow's foot" or incisura was identified.  The sleeve gastrectomy was begun using the Sureform platform stapler beginning with a blue load sure form stapler followed by white loads without any problems firing.. ? ?When the sleeve was complete the tube was taken off suction and insufflated briefly.  The tube was withdrawn.  Upper endoscopy was then performed by Dr. Daphine Deutscher.  A good cylindrical sleeve was seen.  No bleeding was noted.  No bubbles were noted. Dr. Fredricka Bonine placed a couple of clips on the sleeve.   ? ? The specimen was extracted through the 15 trocar site.  Local block was provided by infiltrating abdomen as a TAP block and then closed 4-0 Monocryl and Dermabond.   ? ?Matt B. Daphine Deutscher, MD,  FACS ?[facility], [place] ?[phone]  ?

## 2021-09-09 NOTE — Anesthesia Postprocedure Evaluation (Signed)
Anesthesia Post Note ? ?Patient: Nicholas Haney ? ?Procedure(s) Performed: ROBOTIC SLEEVE GASTRECTOMY (Abdomen) ?UPPER GI ENDOSCOPY (Mouth) ? ?  ? ?Patient location during evaluation: PACU ?Anesthesia Type: General ?Level of consciousness: awake and alert ?Pain management: pain level controlled ?Vital Signs Assessment: post-procedure vital signs reviewed and stable ?Respiratory status: spontaneous breathing, nonlabored ventilation, respiratory function stable and patient connected to nasal cannula oxygen ?Cardiovascular status: blood pressure returned to baseline and stable ?Postop Assessment: no apparent nausea or vomiting ?Anesthetic complications: no ? ? ?No notable events documented. ? ?Last Vitals:  ?Vitals:  ? 09/09/21 1148 09/09/21 1222  ?BP: 122/73 123/61  ?Pulse: 78 71  ?Resp: 16 18  ?Temp: 36.7 ?C 36.9 ?C  ?SpO2: 98% 98%  ?  ?Last Pain:  ?Vitals:  ? 09/09/21 1222  ?TempSrc: Oral  ?PainSc:   ? ? ?  ?  ?  ?  ?  ?  ? ?Maritsa Hunsucker S ? ? ? ? ?

## 2021-09-09 NOTE — Progress Notes (Signed)

## 2021-09-09 NOTE — Anesthesia Preprocedure Evaluation (Signed)
Anesthesia Evaluation  ?Patient identified by MRN, date of birth, ID band ?Patient awake ? ? ? ?Reviewed: ?Allergy & Precautions, NPO status , Patient's Chart, lab work & pertinent test results ? ?Airway ?Mallampati: II ? ?TM Distance: >3 FB ?Neck ROM: Full ? ? ? Dental ?no notable dental hx. ? ?  ?Pulmonary ?neg pulmonary ROS, former smoker,  ?  ?Pulmonary exam normal ?breath sounds clear to auscultation ? ? ? ? ? ? Cardiovascular ?negative cardio ROS ?Normal cardiovascular exam ?Rhythm:Regular Rate:Normal ? ? ?  ?Neuro/Psych ?negative neurological ROS ? negative psych ROS  ? GI/Hepatic ?negative GI ROS, Neg liver ROS,   ?Endo/Other  ?Morbid obesity ? Renal/GU ?negative Renal ROS  ?negative genitourinary ?  ?Musculoskeletal ?negative musculoskeletal ROS ?(+)  ? Abdominal ?  ?Peds ?negative pediatric ROS ?(+)  Hematology ?negative hematology ROS ?(+)   ?Anesthesia Other Findings ? ? Reproductive/Obstetrics ?negative OB ROS ? ?  ? ? ? ? ? ? ? ? ? ? ? ? ? ?  ?  ? ? ? ? ? ? ? ? ?Anesthesia Physical ?Anesthesia Plan ? ?ASA: 2 ? ?Anesthesia Plan: General  ? ?Post-op Pain Management: Tylenol PO (pre-op)*  ? ?Induction: Intravenous ? ?PONV Risk Score and Plan: 2 and Ondansetron, Dexamethasone, Treatment may vary due to age or medical condition and Midazolam ? ?Airway Management Planned: Oral ETT ? ?Additional Equipment:  ? ?Intra-op Plan:  ? ?Post-operative Plan: Extubation in OR ? ?Informed Consent: I have reviewed the patients History and Physical, chart, labs and discussed the procedure including the risks, benefits and alternatives for the proposed anesthesia with the patient or authorized representative who has indicated his/her understanding and acceptance.  ? ? ? ?Dental advisory given ? ?Plan Discussed with: CRNA and Surgeon ? ?Anesthesia Plan Comments:   ? ? ? ? ? ? ?Anesthesia Quick Evaluation ? ?

## 2021-09-09 NOTE — Interval H&P Note (Signed)
History and Physical Interval Note: ? ?09/09/2021 ?7:19 AM ? ?Nicholas Haney  has presented today for surgery, with the diagnosis of morbid obesity.  The various methods of treatment have been discussed with the patient and family. After consideration of risks, benefits and other options for treatment, the patient has consented to  Procedure(s): ?robotic sleeve gastrectomy (N/A) ?UPPER GI ENDOSCOPY (N/A) as a surgical intervention.  The patient's history has been reviewed, patient examined, no change in status, stable for surgery.  I have reviewed the patient's chart and labs.  Questions were answered to the patient's satisfaction.   ? ? ?[name] ? ? ?

## 2021-09-09 NOTE — Progress Notes (Addendum)
PHARMACY CONSULT FOR:  Risk Assessment for Post-Discharge VTE Following Bariatric Surgery ? ?Post-Discharge VTE Risk Assessment: ?This patient's probability of 30-day post-discharge VTE is increased due to the factors marked: ?x Sleeve gastrectomy  ? Liver disorder (transplant, cirrhosis, or nonalcoholic steatohepatitis)  ? Hx of VTE  ? Hemorrhage requiring transfusion  ? GI perforation, leak, or obstruction  ? ====================================================  ?x  Male  ?  Age >/=60 years  ?  BMI >/=50 kg/m2  ?  CHF  ?  Dyspnea at Rest  ?  Paraplegia  ?x  Non-gastric-band surgery  ?  Operation Time >/=3 hr  ?  Return to OR   ?  Length of Stay >/= 3 d  ? Hypercoagulable condition  ? Significant venous stasis  ? ? ? ? ?Predicted probability of 30-day post-discharge VTE: 0.31% ? ?Other patient-specific factors to consider: ? ? ?Recommendation for Discharge: ?No pharmacologic prophylaxis post-discharge ? ? ?Nicholas Haney is a 41 y.o. male who underwent Robotic sleeve gastrectomy and upper endoscopy on 09/09/2021 ? ?Case start: 0800 ?Case end: 0954 ? ? ?No Known Allergies ? ?Patient Measurements: ?Height: 5\' 10"  (177.8 cm) ?Weight: 125.1 kg (275 lb 12.8 oz) ?IBW/kg (Calculated) : 73 ?Body mass index is 39.57 kg/m?. ? ?No results for input(s): WBC, HGB, HCT, PLT, APTT, CREATININE, LABCREA, CREATININE, CREAT24HRUR, MG, PHOS, ALBUMIN, PROT, ALBUMIN, AST, ALT, ALKPHOS, BILITOT, BILIDIR, IBILI in the last 72 hours. ?Estimated Creatinine Clearance: 162.8 mL/min (by C-G formula based on SCr of 0.76 mg/dL). ? ? ? ?Past Medical History:  ?Diagnosis Date  ? Anxiety   ? Chronic knee pain   ? Depression   ? Herniated disc   ? ? ? ?Medications Prior to Admission  ?Medication Sig Dispense Refill Last Dose  ? acetaminophen (TYLENOL) 500 MG tablet Take 1,000 mg by mouth every 6 (six) hours as needed for moderate pain.   More than a month  ? ? ? , PharmD, BCPS ?Pharmacy: [facility] ?09/09/2021,10:25 AM ? ?

## 2021-09-09 NOTE — Transfer of Care (Signed)
Immediate Anesthesia Transfer of Care Note ? ?Patient: Nicholas Haney ? ?Procedure(s) Performed: ROBOTIC SLEEVE GASTRECTOMY (Abdomen) ?UPPER GI ENDOSCOPY (Mouth) ? ?Patient Location: PACU ? ?Anesthesia Type:General ? ?Level of Consciousness: awake, alert  and oriented ? ?Airway & Oxygen Therapy: Patient Spontanous Breathing and Patient connected to face mask oxygen ? ?Post-op Assessment: Report given to RN and Post -op Vital signs reviewed and stable ? ?Post vital signs: Reviewed and stable ? ?Last Vitals:  ?Vitals Value Taken Time  ?BP 135/72 09/09/21 1004  ?Temp    ?Pulse 95 09/09/21 1006  ?Resp 17 09/09/21 1006  ?SpO2 99 % 09/09/21 1006  ?Vitals shown include unvalidated device data. ? ?Last Pain:  ?Vitals:  ? 09/09/21 0626  ?TempSrc:   ?PainSc: 0-No pain  ?   ? ?  ? ?Complications: No notable events documented. ?

## 2021-09-10 ENCOUNTER — Encounter (HOSPITAL_COMMUNITY): Payer: Self-pay | Admitting: Surgery

## 2021-09-10 LAB — CBC WITH DIFFERENTIAL/PLATELET
Abs Immature Granulocytes: 0.07 10*3/uL (ref 0.00–0.07)
Basophils Absolute: 0 10*3/uL (ref 0.0–0.1)
Basophils Relative: 0 %
Eosinophils Absolute: 0 10*3/uL (ref 0.0–0.5)
Eosinophils Relative: 0 %
HCT: 39.5 % (ref 39.0–52.0)
Hemoglobin: 13.6 g/dL (ref 13.0–17.0)
Immature Granulocytes: 1 %
Lymphocytes Relative: 12 %
Lymphs Abs: 1.7 10*3/uL (ref 0.7–4.0)
MCH: 31.5 pg (ref 26.0–34.0)
MCHC: 34.4 g/dL (ref 30.0–36.0)
MCV: 91.4 fL (ref 80.0–100.0)
Monocytes Absolute: 1 10*3/uL (ref 0.1–1.0)
Monocytes Relative: 7 %
Neutro Abs: 11.3 10*3/uL — ABNORMAL HIGH (ref 1.7–7.7)
Neutrophils Relative %: 80 %
Platelets: 233 10*3/uL (ref 150–400)
RBC: 4.32 MIL/uL (ref 4.22–5.81)
RDW: 12.4 % (ref 11.5–15.5)
WBC: 14.2 10*3/uL — ABNORMAL HIGH (ref 4.0–10.5)
nRBC: 0 % (ref 0.0–0.2)

## 2021-09-10 LAB — SURGICAL PATHOLOGY

## 2021-09-10 MED ORDER — OXYCODONE HCL 5 MG PO TABS: 5.0000 mg | ORAL_TABLET | Freq: Four times a day (QID) | ORAL | 0 refills | Status: DC | PRN

## 2021-09-10 MED ORDER — ONDANSETRON 4 MG PO TBDP: 4.0000 mg | ORAL_TABLET | Freq: Four times a day (QID) | ORAL | 0 refills | Status: DC | PRN

## 2021-09-10 MED ORDER — PANTOPRAZOLE SODIUM 40 MG PO TBEC: 40.0000 mg | DELAYED_RELEASE_TABLET | Freq: Every day | ORAL | 0 refills | Status: DC

## 2021-09-10 NOTE — Discharge Summary (Signed)
Physician Discharge Summary  ?Patient ID: ?Nicholas Haney ?MRN: [id number] ?DOB/AGE: 02-07-1981 40 y.o. ? ?PCP: Berniece Salines, FNP ? ?Admit date: 09/09/2021 ?Discharge date: 09/10/2021 ? ?Admission Diagnoses:  morbid obesity ? ?Discharge Diagnoses:  same  ?Principal Problem: ?  S/P laparoscopic sleeve gastrectomy ? ? ?Surgery:  robotic sleeve gastrectomy ? ?Discharged Condition: improved ? ?Hospital Course:   had surgery on Tuesday.  Begun on liquids and advanced to full protein shakes ? ?Consults: none ? ?Significant Diagnostic Studies: none ? ? ? ?Discharge Exam: ?Blood pressure 99/64, pulse 64, temperature 98.5 ?F (36.9 ?C), resp. rate 16, height 5\' 10"  (1.778 m), weight 125.1 kg, SpO2 96 %. ?Incisions bland ? ?Disposition: Discharge disposition: 01-Home or Self Care ? ? ? ? ? ? ?Discharge Instructions   ? ? Ambulate hourly while awake   Complete by: As directed ?  ? Call MD for:  difficulty breathing, headache or visual disturbances   Complete by: As directed ?  ? Call MD for:  persistant dizziness or light-headedness   Complete by: As directed ?  ? Call MD for:  persistant nausea and vomiting   Complete by: As directed ?  ? Call MD for:  redness, tenderness, or signs of infection (pain, swelling, redness, odor or green/yellow discharge around incision site)   Complete by: As directed ?  ? Call MD for:  severe uncontrolled pain   Complete by: As directed ?  ? Call MD for:  temperature >101 F   Complete by: As directed ?  ? Diet bariatric full liquid   Complete by: As directed ?  ? Incentive spirometry   Complete by: As directed ?  ? Perform hourly while awake  ? ?  ? ?Allergies as of 09/10/2021   ?No Known Allergies ?  ? ?  ?Medication List  ?  ? ?TAKE these medications   ? ?acetaminophen 500 MG tablet ?Commonly known as: TYLENOL ?Take 1,000 mg by mouth every 6 (six) hours as needed for moderate pain. ?  ?ondansetron 4 MG disintegrating tablet ?Commonly known as: ZOFRAN-ODT ?Take 1 tablet (4 mg total) by mouth  every 6 (six) hours as needed for nausea or vomiting. ?  ?oxyCODONE 5 MG immediate release tablet ?Commonly known as: Oxy IR/ROXICODONE ?Take 1 tablet (5 mg total) by mouth every 6 (six) hours as needed for severe pain. ?  ?pantoprazole 40 MG tablet ?Commonly known as: PROTONIX ?Take 1 tablet (40 mg total) by mouth daily. ?  ? ?  ? ? Follow-up Information   ? ? 11/10/2021, MD. Luretha Murphy on 10/01/2021.   ?Specialty: General Surgery ?Why: at 10:45am at the [facility] OFFICE.  Please arrive 15 minutes prior to your appointment time.  Thank you. ?Contact information: ?[address] ?[address] ?[place] ?[phone] ? ? ?  ?  ? ? [phone], MD. Luretha Murphy on 10/29/2021.   ?Specialty: General Surgery ?Why: at 9am at the [facility] OFFICE. Please arrive 15 minutes prior to your appointment time.  Thank you. ?Contact information: ?[address] ?[address] ?[place] ?[phone] ? ? ?  ?  ? ?  ?  ? ?  ? ? ?Signed: ?[phone] ?09/10/2021, 10:23 PM ?  ?

## 2021-09-10 NOTE — Progress Notes (Signed)
Transition of Care (TOC) Screening Note ? ?Patient Details  ?Name: Nicholas Haney ?Date of Birth: 24-Mar-1981 ? ?Transition of Care (TOC) CM/SW Contact:    ?Ewing Schlein, LCSW ?Phone Number: ?09/10/2021, 11:39 AM ? ?Transition of Care Department [facility]) has reviewed patient and no TOC needs have been identified at this time. We will continue to monitor patient advancement through interdisciplinary progression rounds. If new patient transition needs arise, please place a TOC consult. ?

## 2021-09-10 NOTE — Plan of Care (Signed)

## 2021-09-10 NOTE — Progress Notes (Signed)
Patient alert and oriented, pain is controlled. Patient is tolerating fluids, advanced to protein shake today, patient is tolerating well.  Reviewed Gastric sleeve discharge instructions with patient and patient is able to articulate understanding.  Provided information on BELT program, Support Group and [facility] outpatient pharmacy. All questions answered, will continue to monitor.  

## 2021-09-10 NOTE — Progress Notes (Signed)
24hr fluid recall prior to discharge: 540mL. Per dehydration protocol, will call pt to f/u within one week post op. 

## 2021-09-10 NOTE — Progress Notes (Signed)
Assessment unchanged. Pt and wife Verbalized understanding of dc instructions given by Myself and Madlyn Frankel, RN. No questions. Understands when to follow up with Dr. Hassell Done. Discharged via wc to front entrance accompanied by NT and wife. ?

## 2021-09-12 ENCOUNTER — Telehealth (HOSPITAL_COMMUNITY): Payer: Self-pay | Admitting: *Deleted

## 2021-09-12 NOTE — Telephone Encounter (Signed)
1.  Tell me about your pain and pain management? ?Pt denies any pain. ? ?2.  Let's talk about fluid intake.  How much total fluid are you taking in? ?Pt states that he is getting in at least 64oz of fluid including protein shakes, bottled water. ?Pt instructed to assess status and suggestions daily utilizing Hydration Action Plan on discharge folder and to call [facility] if in the "red zone".  ? ?3.  How much protein have you taken in the last 2 days? ?Pt states he is meeting his goal of 80g of protein each day with the protein shakes. ? ?4.  Have you had nausea?  Tell me about when have experienced nausea and what you did to help? ?Pt denies nausea. ?  ?5.  Has the frequency or color changed with your urine? ?Pt states that he is urinating "fine" with no changes in frequency or urgency.   ?  ?6.  Tell me what your incisions look like? ?"Incisions look fine". Pt denies a fever, chills.  Pt states incisions are not swollen, open, or draining.  Pt encouraged to call [facility] if incisions change. ?  ?7.  Have you been passing gas? BM? ?Pt states that he has not had a BM.  Pt instructed to take either Miralax or MoM as instructed per "Gastric Bypass/Sleeve Discharge Home Care Instructions".  Pt to call surgeon's office if not able to have BM with medication. ?  ?8.  If a problem or question were to arise who would you call?  Do you know contact numbers for [facility], [facility], and [facility]? ?Pt denies dehydration symptoms.  Pt can describe s/sx of dehydration.  Pt knows to call [facility] for surgical, [facility] for nutrition, and [facility] for non-urgent questions or concerns. ?  ?9.  How has the walking going? ?Pt states he is walking around and able to be active without difficulty. ?  ?10. Are you still using your incentive spirometer?  If so, how often? ?Pt states that he is using the I.S. throughout the day. Pt encouraged to use incentive spirometer, at least 10x every hour while awake until he sees the surgeon. ? ?11.  How are your vitamins and calcium  going?  How are you taking them? ?Pt states that he is taking his supplements and vitamins without difficulty. ? ?Reminded patient that the first 30 days post-operatively are important for successful recovery.  Practice good hand hygiene, wearing a mask when appropriate (since optional in most places), and minimizing exposure to people who live outside of the home, especially if they are exhibiting any respiratory, GI, or illness-like symptoms.   ? ?

## 2021-09-23 ENCOUNTER — Encounter: Payer: Self-pay | Admitting: Primary Care

## 2021-09-23 ENCOUNTER — Ambulatory Visit: Payer: 59 | Admitting: Primary Care

## 2021-09-23 ENCOUNTER — Encounter: Payer: 59 | Attending: Surgery | Admitting: Skilled Nursing Facility1

## 2021-09-23 DIAGNOSIS — E669 Obesity, unspecified: Secondary | ICD-10-CM

## 2021-09-23 DIAGNOSIS — Z6841 Body Mass Index (BMI) 40.0 and over, adult: Secondary | ICD-10-CM | POA: Diagnosis not present

## 2021-09-23 DIAGNOSIS — G4730 Sleep apnea, unspecified: Secondary | ICD-10-CM | POA: Diagnosis not present

## 2021-09-23 DIAGNOSIS — Z6837 Body mass index (BMI) 37.0-37.9, adult: Secondary | ICD-10-CM | POA: Insufficient documentation

## 2021-09-23 DIAGNOSIS — Z713 Dietary counseling and surveillance: Secondary | ICD-10-CM | POA: Diagnosis not present

## 2021-09-23 NOTE — Progress Notes (Signed)
? ?@Patient ID: Nicholas Haney, male    DOB: 05/10/1981, 40 y.o.   MRN: [id number] ? ?Chief Complaint  ?Patient presents with  ? Follow-up  ?  Pt states he has been doing okay since last visit. States he started wearing his CPAP about 1 month ago and states he is doing okay so far.  ? ? ?Referring provider: ?Pender, Julie F, FNP ? ?HPI: ?40 year old male, former smoke quit in 2020. PMH significant for snoring, PTSD, anxiety/depression, covid-19, morbid obesity. ? ?Previous [facility] pulmonary encounter: ?06/27/2021 ?Patient presents today for sleep consult. He has symptoms of loud snoring, witnessed apnea and daytime sleepiness. He has never had sleep study. Typical bedtime is between 10-11pm. It takes him 10-15 mins to fall asleep. He wakes up on average 1-2 times at night. He starts his day at 5am. Weight is up. Epworth 12. Denies narcolepsy or cataplexy.  ? ?Sleep questionnaire: ?Prior sleep study- None ?Symptoms- loud snoring, witnessed apnea, daytime sleepiness ?Bedtime- 10-11pm ?Time to fall asleep-10-15 mins ?Nocturnal awakenings-1-2 times ?Start of day- 5am ?Weight changes- gained weight  ?Epworth- 12 ? ?07/30/2021 ?Patient contacted today for virtual visit to review sleep study results. He had home sleep study on 07/23/21 that showed severe OSA (AHI 43/hr) with SpO2 low 68%. Reviewed sleep study results with patient and treatment options including weight loss, oral appliance, CPAP or referral to ENT. Recommending patient be started on CPAP therapy d.t severity of sleep apnea. He is in agreement with plan.  ? ?09/23/2021 Interim  ?Patient presents today for 1 month CPAP follow-up. During our last visit he was started on auto CPAP for severe obstructive sleep apnea. He is doing alright. 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 He is wearing CPAP 90% of the times. He has not seen a difference in his sleep yet. Pressure 5-20cm h20, residual AHI 1.3. No significant residual daytime sleepiness.  ? ?Airview download  08/24/21-09/06/21 ?Usage 27/30 (90%); 19 days (63%) ?Average usage 4 hours 32 mins ?Pressure 5-20cm h20 ?Airleaks 2.2L/min  ?AHI 1.3  ? ?No Known Allergies ? ?Immunization History  ?Administered Date(s) Administered  ? Anthrax 07/04/2007, 08/03/2007, 03/23/2008  ? Hepatitis A, Adult 07/02/2001, 03/11/2002  ? Hepatitis B, adult 07/01/2002, 10/23/2002, 09/05/2003, 09/24/2003  ? IPV 01/06/2001  ? Influenza-Unspecified 07/01/2002, 05/12/2007, 03/01/2008, 05/02/2008  ? MMR 01/18/2001  ? Meningococcal Conjugate 01/06/2001  ? Smallpox 07/04/2007  ? Td 01/06/2001  ? Tetanus 09/13/2003  ? Typhoid Inactivated 10/23/2002, 07/22/2006  ? Varicella 10/23/2002  ? ? ?Past Medical History:  ?Diagnosis Date  ? Anxiety   ? Chronic knee pain   ? Depression   ? Herniated disc   ? ? ?Tobacco History: ?Social History  ? ?Tobacco Use  ?Smoking Status Former  ? Packs/day: 0.15  ? Types: Cigarettes  ? Quit date: 10/31/2018  ? Years since quitting: 2.8  ?Smokeless Tobacco Never  ? ?Counseling given: Not Answered ? ? ?Outpatient Medications Prior to Visit  ?Medication Sig Dispense Refill  ? acetaminophen (TYLENOL) 500 MG tablet Take 1,000 mg by mouth every 6 (six) hours as needed for moderate pain.    ? pantoprazole (PROTONIX) 40 MG tablet Take 1 tablet (40 mg total) by mouth daily. (Patient not taking: Reported on 09/23/2021) 90 tablet 0  ? ondansetron (ZOFRAN-ODT) 4 MG disintegrating tablet Take 1 tablet (4 mg total) by mouth every 6 (six) hours as needed for nausea or vomiting. 20 tablet 0  ? oxyCODONE (OXY IR/ROXICODONE) 5 MG immediate release tablet Take 1 tablet (  5 mg total) by mouth every 6 (six) hours as needed for severe pain. 10 tablet 0  ? ?No facility-administered medications prior to visit.  ? ?Review of Systems ? ?Review of Systems  ?Constitutional: Negative.   ?HENT: Negative.    ?Respiratory: Negative.    ?Psychiatric/Behavioral: Negative.    ? ? ?Physical Exam ? ?BP 128/80 (BP Location: Left Arm, Patient Position: Sitting, Cuff  Size: Large)   Pulse 60   Temp 98 ?F (36.7 ?C) (Oral)   Ht 5' 10" (1.778 m)   Wt 262 lb 3.2 oz (118.9 kg)   SpO2 97% Comment: RA  BMI 37.62 kg/m?  ?Physical Exam ?Constitutional:   ?   Appearance: Normal appearance.  ?HENT:  ?   Head: Normocephalic and atraumatic.  ?Cardiovascular:  ?   Rate and Rhythm: Normal rate and regular rhythm.  ?Pulmonary:  ?   Effort: Pulmonary effort is normal.  ?   Breath sounds: Normal breath sounds.  ?Musculoskeletal:     ?   General: Normal range of motion.  ?Skin: ?   General: Skin is warm and dry.  ?Neurological:  ?   General: No focal deficit present.  ?   Mental Status: He is alert and oriented to person, place, and time. Mental status is at baseline.  ?Psychiatric:     ?   Behavior: Behavior normal.     ?   Thought Content: Thought content normal.     ?   Judgment: Judgment normal.  ?  ? ?Lab Results: ? ?CBC ?   ?Component Value Date/Time  ? WBC 14.2 (H) 09/10/2021 0404  ? RBC 4.32 09/10/2021 0404  ? HGB 13.6 09/10/2021 0404  ? HGB 15.8 06/27/2014 0457  ? HCT 39.5 09/10/2021 0404  ? HCT 47.0 06/27/2014 0457  ? PLT 233 09/10/2021 0404  ? PLT 205 06/27/2014 0457  ? MCV 91.4 09/10/2021 0404  ? MCV 95 06/27/2014 0457  ? MCH 31.5 09/10/2021 0404  ? MCHC 34.4 09/10/2021 0404  ? RDW 12.4 09/10/2021 0404  ? RDW 12.7 06/27/2014 0457  ? LYMPHSABS 1.7 09/10/2021 0404  ? LYMPHSABS 3.0 06/27/2014 0457  ? MONOABS 1.0 09/10/2021 0404  ? MONOABS 0.8 06/27/2014 0457  ? EOSABS 0.0 09/10/2021 0404  ? EOSABS 0.2 06/27/2014 0457  ? BASOSABS 0.0 09/10/2021 0404  ? BASOSABS 0.0 06/27/2014 0457  ? ? ?BMET ?   ?Component Value Date/Time  ? NA 136 08/27/2021 0916  ? NA 137 06/27/2014 0457  ? K 3.7 08/27/2021 0916  ? K 4.1 06/27/2014 0457  ? CL 105 08/27/2021 0916  ? CL 106 06/27/2014 0457  ? CO2 23 08/27/2021 0916  ? CO2 29 06/27/2014 0457  ? GLUCOSE 107 (H) 08/27/2021 0916  ? GLUCOSE 87 06/27/2014 0457  ? BUN 14 08/27/2021 0916  ? BUN 8 06/27/2014 0457  ? CREATININE 1.05 09/09/2021 1041  ?  CREATININE 0.87 06/27/2014 0457  ? CALCIUM 9.2 08/27/2021 0916  ? CALCIUM 8.3 (L) 06/27/2014 0457  ? GFRNONAA >60 09/09/2021 1041  ? GFRNONAA >60 06/27/2014 0457  ? GFRNONAA >60 02/01/2013 1052  ? GFRAA >60 06/27/2014 0457  ? GFRAA >60 02/01/2013 1052  ? ? ?BNP ?No results found for: BNP ? ?ProBNP ?No results found for: PROBNP ? ?Imaging: ?No results found. ? ? ?Assessment & Plan:  ? ?Severe sleep apnea ?- Patient is 90% compliant with CPAP use (63% > 4 hours). Pressure 5-20cm h20 with residual AHI 1.3. Encourage patient aim to wear CPAP 4-6  hours or more every night. No changes to pressure setting needed today. Continue weight loss efforts. FU virtual visit in 6-8 weeks for compliance check.  ? ?Morbid obesity with BMI of 40.0-44.9, adult (HCC) ?- Patient underwent gastric bypass in April 2023. Once he hits his goal weight we can discuss repeating sleep study to re-evaluate sleep apnea  ? ? ?Elizabeth W Walsh, NP ?09/23/2021 ? ?

## 2021-09-23 NOTE — Assessment & Plan Note (Signed)
-   Patient underwent gastric bypass in April 2023. Once he hits his goal weight we can discuss repeating sleep study to re-evaluate sleep apnea  ?

## 2021-09-23 NOTE — Progress Notes (Signed)
Reviewed and agree with assessment/plan. ? ? ?Chesley Mires, MD ?[place] ?09/23/2021, 1:06 PM ?Pager:  [phone] ? ?

## 2021-09-23 NOTE — Assessment & Plan Note (Addendum)
-   Patient is 90% compliant with CPAP use (63% > 4 hours). Pressure 5-20cm h20 with residual AHI 1.3. Encourage patient aim to wear CPAP 4-6 hours or more every night. No changes to pressure setting needed today. Continue weight loss efforts. FU virtual visit in 6-8 weeks for compliance check.  ?

## 2021-09-23 NOTE — Patient Instructions (Signed)
Nice seeing you today, great work wearing your CPAP. Aim to wear CPAP 4 or more hours every night (goal is 70% of the time). Do not drive if tired. Continue to work on weight loss, once you get to weight goal we can discuss repeating sleep study  ? ?Follow-up: ?6-8 week virtual video visi with Beth NP (compliance check) ? ? ?CPAP and BIPAP Information ?CPAP and BIPAP are methods that use air pressure to keep your airways open and to help you breathe well. CPAP and BIPAP use different amounts of pressure. Your health care provider will tell you whether CPAP or BIPAP would be more helpful for you. ?CPAP stands for "continuous positive airway pressure." With CPAP, the amount of pressure stays the same while you breathe in (inhale) and out (exhale). ?BIPAP stands for "bi-level positive airway pressure." With BIPAP, the amount of pressure will be higher when you inhale and lower when you exhale. This allows you to take larger breaths. ?CPAP or BIPAP may be used in the hospital, or your health care provider may want you to use it at home. You may need to have a sleep study before your health care provider can order a machine for you to use at home. ?What are the advantages? ?CPAP or BIPAP can be helpful if you have: ?Sleep apnea. ?Chronic obstructive pulmonary disease (COPD). ?Heart failure. ?Medical conditions that cause muscle weakness, including muscular dystrophy or amyotrophic lateral sclerosis (ALS). ?Other problems that cause breathing to be shallow, weak, abnormal, or difficult. ?CPAP and BIPAP are most commonly used for obstructive sleep apnea (OSA) to keep the airways from collapsing when the muscles relax during sleep. ?What are the risks? ?Generally, this is a safe treatment. However, problems may occur, including: ?Irritated skin or skin sores if the mask does not fit properly. ?Dry or stuffy nose or nosebleeds. ?Dry mouth. ?Feeling gassy or bloated. ?Sinus or lung infection if the equipment is not cleaned  properly. ?When should CPAP or BIPAP be used? ?In most cases, the mask only needs to be worn during sleep. Generally, the mask needs to be worn throughout the night and during any daytime naps. People with certain medical conditions may also need to wear the mask at other times, such as when they are awake. Follow instructions from your health care provider about when to use the machine. ?What happens during CPAP or BIPAP? ? ?Both CPAP and BIPAP are provided by a small machine with a flexible plastic tube that attaches to a plastic mask that you wear. Air is blown through the mask into your nose or mouth. The amount of pressure that is used to blow the air can be adjusted on the machine. Your health care provider will set the pressure setting and help you find the best mask for you. ?Tips for using the mask ?Because the mask needs to be snug, some people feel trapped or closed-in (claustrophobic) when first using the mask. If you feel this way, you may need to get used to the mask. One way to do this is to hold the mask loosely over your nose or mouth and then gradually apply the mask more snugly. You can also gradually increase the amount of time that you use the mask. ?Masks are available in various types and sizes. If your mask does not fit well, talk with your health care provider about getting a different one. Some common types of masks include: ?Full face masks, which fit over the mouth and nose. ?Nasal masks,  which fit over the nose. ?Nasal pillow or prong masks, which fit into the nostrils. ?If you are using a mask that fits over your nose and you tend to breathe through your mouth, a chin strap may be applied to help keep your mouth closed. ?Use a skin barrier to protect your skin as told by your health care provider. ?Some CPAP and BIPAP machines have alarms that may sound if the mask comes off or develops a leak. ?If you have trouble with the mask, it is very important that you talk with your health care  provider about finding a way to make the mask easier to tolerate. Do not stop using the mask. There could be a negative impact on your health if you stop using the mask. ?Tips for using the machine ?Place your CPAP or BIPAP machine on a secure table or stand near an electrical outlet. ?Know where the on/off switch is on the machine. ?Follow instructions from your health care provider about how to set the pressure on your machine and when you should use it. ?Do not eat or drink while the CPAP or BIPAP machine is on. Food or fluids could get pushed into your lungs by the pressure of the CPAP or BIPAP. ?For home use, CPAP and BIPAP machines can be rented or purchased through home health care companies. Many different brands of machines are available. Renting a machine before purchasing may help you find out which particular machine works well for you. Your health insurance company may also decide which machine you may get. ?Keep the CPAP or BIPAP machine and attachments clean. Ask your health care provider for specific instructions. ?Check the humidifier if you have a dry stuffy nose or nosebleeds. Make sure it is working correctly. ?Follow these instructions at home: ?Take over-the-counter and prescription medicines only as told by your health care provider. Ask if you can take sinus medicine if your sinuses are blocked. ?Do not use any products that contain nicotine or tobacco. These products include cigarettes, chewing tobacco, and vaping devices, such as e-cigarettes. If you need help quitting, ask your health care provider. ?Keep all follow-up visits. This is important. ?Contact a health care provider if: ?You have redness or pressure sores on your head, face, mouth, or nose from the mask or head gear. ?You have trouble using the CPAP or BIPAP machine. ?You cannot tolerate wearing the CPAP or BIPAP mask. ?Someone tells you that you snore even when wearing your CPAP or BIPAP. ?Get help right away if: ?You have  trouble breathing. ?You feel confused. ?Summary ?CPAP and BIPAP are methods that use air pressure to keep your airways open and to help you breathe well. ?If you have trouble with the mask, it is very important that you talk with your health care provider about finding a way to make the mask easier to tolerate. Do not stop using the mask. There could be a negative impact to your health if you stop using the mask. ?Follow instructions from your health care provider about when to use the machine. ?This information is not intended to replace advice given to you by your health care provider. Make sure you discuss any questions you have with your health care provider. ?Document Revised: 01/01/2021 Document Reviewed: 05/03/2020 ?Elsevier Patient Education ? 2023 [employer] ? ?

## 2021-09-24 NOTE — Progress Notes (Signed)
2 Week Post-Operative Nutrition Class ?  ?Patient was seen on 09/23/2021 for Post-Operative Nutrition education at the [facility].  ?  ?Surgery date: 09/09/2021 ?Surgery type: sleeve ?Start weight at [facility]: 282 ?Weight today: 258.8 ? ?Clinical  ?Medical hx: anxiety, depression  ?Medications: N/A  ?Labs: vitamin D 16, LDL 115 ?Notable signs/symptoms: headaches a couple times a week ?Any previous deficiencies? No ?  ?Body Composition Scale 09/23/2021  ?Current Body Weight 258.8  ?Total Body Fat % 31.7  ?Visceral Fat 22  ?Fat-Free Mass % 68.2  ? Total Body Water % 49.2  ?Muscle-Mass lbs 48.7  ?BMI 37.1  ?Body Fat Displacement   ?       Torso  lbs 50.9  ?       Left Leg  lbs 10.1  ?       Right Leg  lbs 10.1  ?       Left Arm  lbs 5.0  ?       Right Arm   lbs 5.0  ? ?Pt requested on Post-Op Screening form that he wants to discuss types of food and beverages that he can consume, during future visits ?Pt having constipation. Discussed concerns in class. ? ?  ?The following the learning objectives were met by the patient during this course: ?Identifies Phase 3 (Soft, High Proteins) Dietary Goals and will begin from 2 weeks post-operatively to 2 months post-operatively ?Identifies appropriate sources of fluids and proteins  ?Identifies appropriate fat sources and healthy verses unhealthy fat types   ?States protein recommendations and appropriate sources post-operatively ?Identifies the need for appropriate texture modifications, mastication, and bite sizes when consuming solids ?Identifies appropriate fat consumption and sources ?Identifies appropriate multivitamin and calcium sources post-operatively ?Describes the need for physical activity post-operatively and will follow MD recommendations ?States when to call healthcare provider regarding medication questions or post-operative complications ?  ?Handouts given during class include: ?Phase 3A: Soft, High Protein Diet Handout ?Phase 3 High  Protein Meals ?Healthy Fats ?  ?Follow-Up Plan: ?Patient will follow-up at [facility] in 6 weeks for 2 month post-op nutrition visit for diet advancement per MD.  ?

## 2021-09-29 ENCOUNTER — Telehealth: Payer: Self-pay | Admitting: Skilled Nursing Facility1

## 2021-09-29 NOTE — Telephone Encounter (Signed)
RD called pt to verify fluid intake once starting soft, solid proteins 2 week post-bariatric surgery.   Daily Fluid intake:  Daily Protein intake: Bowel Habits:   Concerns/issues:    LVM 

## 2021-11-04 ENCOUNTER — Encounter: Payer: 59 | Attending: Nurse Practitioner | Admitting: Skilled Nursing Facility1

## 2021-11-04 ENCOUNTER — Encounter: Payer: Self-pay | Admitting: Skilled Nursing Facility1

## 2021-11-04 ENCOUNTER — Telehealth (INDEPENDENT_AMBULATORY_CARE_PROVIDER_SITE_OTHER): Payer: 59 | Admitting: Primary Care

## 2021-11-04 ENCOUNTER — Ambulatory Visit: Payer: 59 | Admitting: Skilled Nursing Facility1

## 2021-11-04 DIAGNOSIS — G4730 Sleep apnea, unspecified: Secondary | ICD-10-CM

## 2021-11-04 DIAGNOSIS — E669 Obesity, unspecified: Secondary | ICD-10-CM | POA: Insufficient documentation

## 2021-11-04 NOTE — Progress Notes (Addendum)
Virtual Visit via Telephone Note  I connected with Nicholas Haney on 11/04/21 at  9:30 AM EDT by telephone and verified that I am speaking with the correct person using two identifiers.  Location: Patient: Home Provider: Office    I discussed the limitations, risks, security and privacy concerns of performing an evaluation and management service by telephone and the availability of in person appointments. I also discussed with the patient that there may be a patient responsible charge related to this service. The patient expressed understanding and agreed to proceed.  History of Present Illness: 41 year old male, former smoke quit in 2020. PMH significant for snoring, PTSD, anxiety/depression, covid-19, morbid obesity.  Previous [facility] pulmonary encounter: 06/27/2021 Patient presents today for sleep consult. He has symptoms of loud snoring, witnessed apnea and daytime sleepiness. He has never had sleep study. Typical bedtime is between 10-11pm. It takes him 10-15 mins to fall asleep. He wakes up on average 1-2 times at night. He starts his day at 5am. Weight is up. Epworth 12. Denies narcolepsy or cataplexy.   Sleep questionnaire: Prior sleep study- None Symptoms- loud snoring, witnessed apnea, daytime sleepiness Bedtime- 10-11pm Time to fall asleep-10-15 mins Nocturnal awakenings-1-2 times Start of day- 5am Weight changes- gained weight  Epworth- 12  07/30/2021 Patient contacted today for virtual visit to review sleep study results. He had home sleep study on 07/23/21 that showed severe OSA (AHI 43/hr) with SpO2 low 68%. Reviewed sleep study results with patient and treatment options including weight loss, oral appliance, CPAP or referral to ENT. Recommending patient be started on CPAP therapy d.t severity of sleep apnea. He is in agreement with plan.   09/23/2021  Patient presents today for 1 month CPAP follow-up. During our last visit he was started on auto CPAP for severe obstructive  sleep apnea. He is doing alright. 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 He is wearing CPAP 90% of the times. He has not seen a difference in his sleep yet. Pressure 5-20cm h20, residual AHI 1.3. No significant residual daytime sleepiness.   Airview download 08/24/21-09/06/21 Usage 27/30 (90%); 19 days (63%) Average usage 4 hours 32 mins Pressure 5-20cm h20 Airleaks 2.2L/min  AHI 1.3   11/04/2021 Patient contacted today for 8 week follow-up. He is moderately compliant with CPAP wearing mask 83%, average usage 4 hours 28 mins. Current CPAP preasure 5-20cm h20. Airleaks 0.6L/min. AHI 0.8/hr. He is currently using full face mask. His mask keeps fallings off at night and airleaks will wake him up. He has an apt with DME company this week to get fitted for nasal mask. Snoring has improved since beginning PAP. He still has some daytime sleepiness, he trys to take occasional nap. He has noticed pressure in his ears since using CPAP.   Airview download 09/30/21-10/29/21 Usage 25/30 days (83%); 20 days (67%) > 4 hours Average usage 4 hours 28 mins Pressure 5-20cm h20 (10.2cm h20- 95%) Airleaks 0.6L/min AHI 0.8   Observations/Objective:  - Appears well; No resp complaints  Assessment and Plan:  Severe obstructive sleep apnea - Home sleep study on 07/23/21 that showed severe OSA (AHI 43/hr) with SpO2 low 68%. Patient is 83% compliant with CPAP use with average usage 4 hours 48 minutes.  He is having difficulty with mask fit, air leakage and ear pressure.  He is scheduled for mask fitting with DME company later this week.  Current pressure 5 to 20 cm H2O with residual AHI 0.8.  Average air leaks 0.6 L/min (95%).  Recommend decreasing CPAP pressure 5-15cm h20.  Patient continues to have ear pressure recommend ENT evaluation.  Encourage CPAP use every night for minimum 4 to 6 hours or longer.    Follow Up Instructions:  -3 months with Waynetta Sandy NP virtual okay if providers in [place]   I discussed the  assessment and treatment plan with the patient. The patient was provided an opportunity to ask questions and all were answered. The patient agreed with the plan and demonstrated an understanding of the instructions.   The patient was advised to call back or seek an in-person evaluation if the symptoms worsen or if the condition fails to improve as anticipated.  I provided 22 minutes of non-face-to-face time during this encounter.   Glenford Bayley, NP

## 2021-11-04 NOTE — Patient Instructions (Addendum)
Orders: Adjust CPAP setting 5-15cm h20  Follow-up: 3 months or sooner if needed

## 2021-11-04 NOTE — Addendum Note (Signed)
Addended by: Gavin Potters R on: 11/04/2021 10:39 AM   Modules accepted: Orders

## 2021-11-04 NOTE — Progress Notes (Signed)
Bariatric Nutrition Follow-Up Visit Medical Nutrition Therapy    NUTRITION ASSESSMENT    Surgery date: 09/09/2021 Surgery type: sleeve Start weight at [facility]: 282 Weight today: virtual appt; pt identified by name and DOB pt agreeable to limitations of this visit type  Clinical  Medical hx: anxiety, depression  Medications: N/A  Labs: vitamin D 16, LDL 115 Notable signs/symptoms: headaches a couple times a week Any previous deficiencies? No   Body Composition Scale 09/23/2021  Current Body Weight 258.8  Total Body Fat % 31.7  Visceral Fat 22  Fat-Free Mass % 68.2   Total Body Water % 49.2  Muscle-Mass lbs 48.7  BMI 37.1  Body Fat Displacement          Torso  lbs 50.9         Left Leg  lbs 10.1         Right Leg  lbs 10.1         Left Arm  lbs 5.0         Right Arm   lbs 5.0     Lifestyle & Dietary Hx  Pt states he [facility] the [facility].  One overeating event with a little discomfort but learned his lesson.  Pt states he is just not hungry.  Pt states his coworkers are really supportive. Pt states there isa  woman at work that had the surgery and she is helpful.  Pt states he weighs 237 pounds.  Pt state she was able to run to an incident at work and was not out of breath! Pt states he still feels fatigued and tired: thinking for now no carbs and once those are added in will fell better but will revaluate if not better.  Pt state he will add in weights in about a week.    Pt states since going back to work he has fallen off of stuff but plans to find his groove here shortly.   Estimated daily fluid intake: unknown guesses about 5 cups oz Estimated daily protein intake: unknown g Supplements: no longer taking due to forgetting: pt states he will do better Current average weekly physical activity: treadmill inconsistent    24-Hr Dietary Recall First Meal 6am: core power protein shake Snack : yogurt or peanuts or peanut butter balls Second Meal: peanut butter  balls Snack:  peanut butter crackers Third Meal 8pm: beef or chicken or fish Snack:  Beverages: water, gatorade zero  Post-Op Goals/ Signs/ Symptoms Using straws: no Drinking while eating: no Chewing/swallowing difficulties: no Changes in vision: no Changes to mood/headaches: no Hair loss/changes to skin/nails: no Difficulty focusing/concentrating: no Sweating: no Limb weakness: no Dizziness/lightheadedness: no Palpitations: no  Carbonated/caffeinated beverages: no N/V/D/C/Gas: constipation using MiraLax which helps Abdominal pain: no Dumping syndrome: no    NUTRITION DIAGNOSIS  Overweight/obesity ([place]-3.3) related to past poor dietary habits and physical inactivity as evidenced by completed bariatric surgery and following dietary guidelines for continued weight loss and healthy nutrition status.     NUTRITION INTERVENTION Nutrition counseling (C-1) and education (E-2) to facilitate bariatric surgery goals, including: Diet advancement to the next phase (phase 4) now including non starchy vegetables  The importance of consuming adequate calories as well as certain nutrients daily due to the body's need for essential vitamins, minerals, and fats The importance of daily physical activity and to reach a goal of at least 150 minutes of moderate to vigorous physical activity weekly (or as directed by their physician) due to benefits such as increased musculature  and improved lab values The importance of intuitive eating specifically learning hunger-satiety cues and understanding the importance of learning a new body: The importance of mindful eating to avoid grazing behaviors   Goals: -Continue to aim for a minimum of 64 fluid ounces 7 days a week with at least 30 ounces being plain water  -Eat non-starchy vegetables 2 times a day 7 days a week  -Start out with soft cooked vegetables today and tomorrow; if tolerated begin to eat raw vegetables or cooked including salads  -Eat your  3 ounces of protein first then start in on your non-starchy vegetables; once you understand how much of your meal leads to satisfaction and not full while still eating 3 ounces of protein and non-starchy vegetables you can eat them in any order   -Continue to aim for 30 minutes of activity at least 5 times a week  -Do NOT cook with/add to your food: alfredo sauce, cheese sauce, barbeque sauce, ketchup, fat back, butter, bacon grease, grease, Crisco, OR SUGAR -added in fruit  Handouts Provided Include  Phase 4  Learning Style & Readiness for Change Teaching method utilized: Visual & Auditory  Demonstrated degree of understanding via: Teach Back  Readiness Level: action Barriers to learning/adherence to lifestyle change: none identfied   RD's Notes for Next Visit Assess adherence to pt chosen goals     MONITORING & EVALUATION Dietary intake, weekly physical activity, body weight  Next Steps Patient is to follow-up in 2 months

## 2022-01-05 ENCOUNTER — Encounter: Payer: 59 | Attending: Nurse Practitioner | Admitting: Skilled Nursing Facility1

## 2022-01-05 DIAGNOSIS — Z713 Dietary counseling and surveillance: Secondary | ICD-10-CM | POA: Insufficient documentation

## 2022-01-05 DIAGNOSIS — Z6841 Body Mass Index (BMI) 40.0 and over, adult: Secondary | ICD-10-CM | POA: Insufficient documentation

## 2022-01-05 NOTE — Progress Notes (Signed)
Bariatric Nutrition Follow-Up Visit Medical Nutrition Therapy    NUTRITION ASSESSMENT    Surgery date: 09/09/2021 Surgery type: sleeve Start weight at [facility]: 282 Weight today: virtual appt; pt identified by name and DOB pt agreeable to limitations of this visit type  Clinical  Medical hx: anxiety, depression  Medications: N/A  Labs: vitamin D 16, LDL 115 Notable signs/symptoms: headaches a couple times a week Any previous deficiencies? No   Body Composition Scale 09/23/2021  Current Body Weight 258.8  Total Body Fat % 31.7  Visceral Fat 22  Fat-Free Mass % 68.2   Total Body Water % 49.2  Muscle-Mass lbs 48.7  BMI 37.1  Body Fat Displacement          Torso  lbs 50.9         Left Leg  lbs 10.1         Right Leg  lbs 10.1         Left Arm  lbs 5.0         Right Arm   lbs 5.0     Lifestyle & Dietary Hx   Pt states he avoids carbs and vegetables for fear of weight gain.   Estimated daily fluid intake: states he knows he is not drinking enough Estimated daily protein intake: unknown g Supplements: no longer taking due to forgetting: pt states he will do better Current average weekly physical activity: treadmill inconsistent    24-Hr Dietary Recall First Meal 6am: core power protein shake Snack : yogurt or peanuts or peanut butter balls Second Meal: chicken or beef Snack:  yogurt or peanuts or peanut butter balls Third Meal 8pm: beef or chicken or fish + high fiber tortilla Snack:  Beverages: water, gatorade zero, coffee  Post-Op Goals/ Signs/ Symptoms Using straws: no Drinking while eating: no Chewing/swallowing difficulties: no Changes in vision: no Changes to mood/headaches: no Hair loss/changes to skin/nails: no Difficulty focusing/concentrating: no Sweating: no Limb weakness: no Dizziness/lightheadedness: no Palpitations: no  Carbonated/caffeinated beverages: no N/V/D/C/Gas: constipation using MiraLax which helps Abdominal pain: no Dumping syndrome:  no    NUTRITION DIAGNOSIS  Overweight/obesity ([place]-3.3) related to past poor dietary habits and physical inactivity as evidenced by completed bariatric surgery and following dietary guidelines for continued weight loss and healthy nutrition status.     NUTRITION INTERVENTION Nutrition counseling (C-1) and education (E-2) to facilitate bariatric surgery goals, including: The importance of consuming adequate calories as well as certain nutrients daily due to the body's need for essential vitamins, minerals, and fats The importance of daily physical activity and to reach a goal of at least 150 minutes of moderate to vigorous physical activity weekly (or as directed by their physician) due to benefits such as increased musculature and improved lab values The importance of intuitive eating specifically learning hunger-satiety cues and understanding the importance of learning a new body: The importance of mindful eating to avoid grazing behaviors  Importance of vegetables To have an overall healthy diet, adult men and women are recommended to consume anywhere from 2-3 cups of vegetables daily. Vegetables provide a wide range of vitamins and minerals such as vitamin A, vitamin C, potassium, and folic acid. According to the [employer], including fruit and vegetables daily may reduce the risk of cardiovascular disease, certain cancers, and other non-communicable diseases. Why you need complex carbohydrates: Whole grains and other complex carbohydrates are required to have a healthy diet. Whole grains provide fiber which can help with blood glucose levels and help keep  you satiated. Fruits and starchy vegetables provide essential vitamins and minerals required for immune function, eyesight support, brain support, bone density, wound healing and many other functions within the body. According to the current evidenced based 2020-2025 Dietary Guidelines for Americans, complex carbohydrates are part of  a healthy eating pattern which is associated with a decreased risk for type 2 diabetes, cancers, and cardiovascular disease.   Handouts Provided Include  Phase 7  Learning Style & Readiness for Change Teaching method utilized: Visual & Auditory  Demonstrated degree of understanding via: Teach Back  Readiness Level: action Barriers to learning/adherence to lifestyle change: none identfied   RD's Notes for Next Visit Assess adherence to pt chosen goals     MONITORING & EVALUATION Dietary intake, weekly physical activity, body weight  Next Steps Patient is to follow-up in 2-3 months

## 2022-03-12 ENCOUNTER — Encounter: Payer: 59 | Attending: Nurse Practitioner | Admitting: Skilled Nursing Facility1

## 2022-03-12 ENCOUNTER — Encounter: Payer: Self-pay | Admitting: Skilled Nursing Facility1

## 2022-03-12 DIAGNOSIS — E669 Obesity, unspecified: Secondary | ICD-10-CM | POA: Insufficient documentation

## 2022-03-12 NOTE — Progress Notes (Signed)
Bariatric Nutrition Follow-Up Visit Medical Nutrition Therapy    NUTRITION ASSESSMENT    Surgery date: 09/09/2021 Surgery type: sleeve Start weight at [facility]: 282 Weight today: virtual appt; pt identified by name and DOB pt agreeable to limitations of this visit type  Clinical  Medical hx: anxiety, depression  Medications: N/A  Labs: vitamin D 16, LDL 115 Notable signs/symptoms: headaches a couple times a week Any previous deficiencies? No   Body Composition Scale 09/23/2021  Current Body Weight 258.8  Total Body Fat % 31.7  Visceral Fat 22  Fat-Free Mass % 68.2   Total Body Water % 49.2  Muscle-Mass lbs 48.7  BMI 37.1  Body Fat Displacement          Torso  lbs 50.9         Left Leg  lbs 10.1         Right Leg  lbs 10.1         Left Arm  lbs 5.0         Right Arm   lbs 5.0     Lifestyle & Dietary Hx   Pt states he avoids carbs for fear of weight gain but did add vegetables back in so is making progress.   Pt states he sometimes feels light headed a couple times a week.  Pt states he is happy with his weight now and does not want to go below 215-210 pounds not wanting to look sick. Pt states he weighs 1-2 times a week to ensure he does not gain weight. Pt states he has been feeling like he is dizzy and has low blood sugar: Dietitian advised pt this is the importance of eating enough complex carbohydrates daily, pt states he is still afraid of them, Dietitian advsied pt not let his food fear drive his decisions.   Estimated daily fluid intake: states he is better with his fluids now Estimated daily protein intake: unknown g Supplements: multivitamin  Current average weekly physical activity: work   24-Hr Dietary Recall First Meal 6am: high fiber wrap chicken salad wrap or fruit (once or twice) 1 apple or 1 banana Snack : yogurt or peanuts or peanut butter balls Second Meal: salad: carrots + cucumber + lettuce + chicken  Snack:  peanut butter crackers Third Meal 8pm:  tortilla wrap and chicken sometimes a little rice and cauliflower or broccoli Snack:  Beverages: water, gatorade zero, black coffee, sometimes monster energy no sugar  Post-Op Goals/ Signs/ Symptoms Using straws: no Drinking while eating: no Chewing/swallowing difficulties: no Changes in vision: no Changes to mood/headaches: no Hair loss/changes to skin/nails: no Difficulty focusing/concentrating: no Sweating: no Limb weakness: no Dizziness/lightheadedness: no Palpitations: no  Carbonated/caffeinated beverages: no N/V/D/C/Gas: constipation using MiraLax which helps Abdominal pain: no Dumping syndrome: no    NUTRITION DIAGNOSIS  Overweight/obesity ([place]-3.3) related to past poor dietary habits and physical inactivity as evidenced by completed bariatric surgery and following dietary guidelines for continued weight loss and healthy nutrition status.     NUTRITION INTERVENTION Nutrition counseling (C-1) and education (E-2) to facilitate bariatric surgery goals, including: The importance of consuming adequate calories as well as certain nutrients daily due to the body's need for essential vitamins, minerals, and fats The importance of daily physical activity and to reach a goal of at least 150 minutes of moderate to vigorous physical activity weekly (or as directed by their physician) due to benefits such as increased musculature and improved lab values The importance of intuitive eating specifically learning hunger-satiety  cues and understanding the importance of learning a new body: The importance of mindful eating to avoid grazing behaviors  Importance of vegetables To have an overall healthy diet, adult men and women are recommended to consume anywhere from 2-3 cups of vegetables daily. Vegetables provide a wide range of vitamins and minerals such as vitamin A, vitamin C, potassium, and folic acid. According to the [employer], including fruit and vegetables daily may  reduce the risk of cardiovascular disease, certain cancers, and other non-communicable diseases. Why you need complex carbohydrates: Whole grains and other complex carbohydrates are required to have a healthy diet. Whole grains provide fiber which can help with blood glucose levels and help keep you satiated. Fruits and starchy vegetables provide essential vitamins and minerals required for immune function, eyesight support, brain support, bone density, wound healing and many other functions within the body. According to the current evidenced based 2020-2025 Dietary Guidelines for Americans, complex carbohydrates are part of a healthy eating pattern which is associated with a decreased risk for type 2 diabetes, cancers, and cardiovascular disease.  Encouraged patient to honor their body's internal hunger and fullness cues.  Throughout the day, check in mentally and rate hunger. Stop eating when satisfied not full regardless of how much food is left on the plate.  Get more if still hungry 20-30 minutes later.  The key is to honor satisfaction so throughout the meal, rate fullness factor and stop when comfortably satisfied not physically full. The key is to honor hunger and fullness without any feelings of guilt or shame.  Pay attention to what the internal cues are, rather than any external factors. This will enhance the confidence you have in listening to your own body and following those internal cues enabling you to increase how often you eat when you are hungry not out of appetite and stop when you are satisfied not full.  Encouraged pt to continue to eat balanced meals inclusive of non starchy vegetables 2 times a day 7 days a week Encouraged pt to choose lean protein sources: limiting beef, pork, sausage, hotdogs, and lunch meat Encourage pt to choose healthy fats such as plant based limiting animal fats Encouraged pt to continue to drink a minium 64 fluid ounces with half being plain water to satisfy  proper hydration   Handouts Provided Include  Phase 7  Learning Style & Readiness for Change Teaching method utilized: Visual & Auditory  Demonstrated degree of understanding via: Teach Back  Readiness Level: action Barriers to learning/adherence to lifestyle change: none identfied   RD's Notes for Next Visit Assess adherence to pt chosen goals     MONITORING & EVALUATION Dietary intake, weekly physical activity, body weight  Next Steps Patient is to follow-up in 2 months

## 2022-05-07 ENCOUNTER — Encounter: Payer: 59 | Admitting: Nurse Practitioner

## 2022-05-11 ENCOUNTER — Telehealth (INDEPENDENT_AMBULATORY_CARE_PROVIDER_SITE_OTHER): Payer: 59 | Admitting: Primary Care

## 2022-05-11 DIAGNOSIS — G4730 Sleep apnea, unspecified: Secondary | ICD-10-CM

## 2022-05-11 NOTE — Progress Notes (Signed)
Reviewed and agree with assessment/plan.   Mailin Coglianese, MD  Pulmonary/Critical Care 05/11/2022, 10:27 AM Pager:  [phone]  

## 2022-05-11 NOTE — Progress Notes (Signed)
Virtual Visit via Video Note  I connected with Nicholas Haney on 05/11/22 at  9:30 AM EST by a video enabled telemedicine application and verified that I am speaking with the correct person using two identifiers.  Location: Patient: Home Provider: Office visit    I discussed the limitations of evaluation and management by telemedicine and the availability of in person appointments. The patient expressed understanding and agreed to proceed.  History of Present Illness: 41 year old male, former smoke quit in 2020. PMH significant for snoring, PTSD, anxiety/depression, covid-19, morbid obesity.  Previous [facility] pulmonary encounter: 06/27/2021 Patient presents today for sleep consult. He has symptoms of loud snoring, witnessed apnea and daytime sleepiness. He has never had sleep study. Typical bedtime is between 10-11pm. It takes him 10-15 mins to fall asleep. He wakes up on average 1-2 times at night. He starts his day at 5am. Weight is up. Epworth 12. Denies narcolepsy or cataplexy.   Sleep questionnaire: Prior sleep study- None Symptoms- loud snoring, witnessed apnea, daytime sleepiness Bedtime- 10-11pm Time to fall asleep-10-15 mins Nocturnal awakenings-1-2 times Start of day- 5am Weight changes- gained weight  Epworth- 12  07/30/2021 Patient contacted today for virtual visit to review sleep study results. He had home sleep study on 07/23/21 that showed severe OSA (AHI 43/hr) with SpO2 low 68%. Reviewed sleep study results with patient and treatment options including weight loss, oral appliance, CPAP or referral to ENT. Recommending patient be started on CPAP therapy d.t severity of sleep apnea. He is in agreement with plan.   09/23/2021  Patient presents today for 1 month CPAP follow-up. During our last visit he was started on auto CPAP for severe obstructive sleep apnea. He is doing alright. 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 He is wearing CPAP 90% of the times. He has not seen a  difference in his sleep yet. Pressure 5-20cm h20, residual AHI 1.3. No significant residual daytime sleepiness.   Airview download 08/24/21-09/06/21 Usage 27/30 (90%); 19 days (63%) Average usage 4 hours 32 mins Pressure 5-20cm h20 Airleaks 2.2L/min  AHI 1.3   11/04/2021 Patient contacted today for 8 week follow-up. He is moderately compliant with CPAP wearing mask 83%, average usage 4 hours 28 mins. Current CPAP preasure 5-20cm h20. Airleaks 0.6L/min. AHI 0.8/hr. He is currently using full face mask. His mask keeps fallings off at night and airleaks will wake him up. He has an apt with DME company this week to get fitted for nasal mask. Snoring has improved since beginning PAP. He still has some daytime sleepiness, he trys to take occasional nap. He has noticed pressure in his ears since using CPAP.   Airview download 09/30/21-10/29/21 Usage 25/30 days (83%); 20 days (67%) > 4 hours Average usage 4 hours 28 mins Pressure 5-20cm h20 (10.2cm h20- 95%) Airleaks 0.6L/min AHI 0.8  05/11/2022 - Interim hx  Patient contacted today for OSA follow-up. Hx severe sleep apnea, AHI 43/hour with SpO2 low 68%. He has not worn CPAP since June 2023. During our last visit he was 83% compliance with use. Pressure 5-20cm h20 with A[place]ge AHI 0.8/hr. He was having difficulty with mask fit, he was scheduled for mask fitting with DME company in May. He tried full face mask and nasal mask. He did not adjust well to wearing CPAP. He got discouraged. He does not want to continue with CPAP.  We discussed alternative treatment options, patient would like to be referred for consideration for oral appliance.   Observations/Objective:  Appears well  without respiratory symptoms or fatigue   Assessment and Plan:  Severe OSA  - Sleep study in February 2023 showed severe obstructive sleep apnea, AHI 43/hour with SpO2 low 69%. He tried and failed CPAP. We discussed alternative treatment options including oral device or referral  to ENT for possible surgical options/inspire device. He would like to try oral appliance for treatment of OSA. I have advised he also look into getting wedge pillow to elevate head 30 degrees while sleeping at night. We will place DME order to discontinue CPAP. Cautioned against driving if experiencing excessive daytime sleepiness and avoiding alcohol/sedating medication prior to bedtime.   Referral Orthodontics for oral appliance  Follow-up 3-4 months with Waynetta Sandy NP  Follow Up Instructions:    I discussed the assessment and treatment plan with the patient. The patient was provided an opportunity to ask questions and all were answered. The patient agreed with the plan and demonstrated an understanding of the instructions.   The patient was advised to call back or seek an in-person evaluation if the symptoms worsen or if the condition fails to improve as anticipated.  I provided 22 minutes of non-face-to-face time during this encounter.   Glenford Bayley, NP

## 2022-05-11 NOTE — Assessment & Plan Note (Signed)
-   Sleep study in February 2023 showed severe obstructive sleep apnea, AHI 43/hour with SpO2 low 69%. He tried and failed CPAP. We discussed alternative treatment options including oral device or referral to ENT for possible surgical options/inspire device. He would like to try oral appliance for treatment of OSA. I have advised he also look into getting wedge pillow to elevate head 30 degrees while sleeping at night. We will place DME order to discontinue CPAP. Cautioned against driving if experiencing excessive daytime sleepiness and avoiding alcohol/sedating medication prior to bedtime.   Referral Orthodontics for oral appliance  Follow-up 3-4 months with Waynetta Sandy NP

## 2022-05-11 NOTE — Patient Instructions (Signed)
Sleep study in February 2023 showed severe obstructive sleep apnea  Patient is intolerant to CPAP  Alternative treatment options include oral appliance or referral to ENT for possible surgical options/inspire device  Also Look into getting wedge pillow to elevate head 30 degrees while sleeping at night  Referral Orthodontics for oral appliance  Follow-up 3-4 months with Waynetta Sandy NP

## 2022-05-12 ENCOUNTER — Telehealth: Payer: Self-pay | Admitting: Primary Care

## 2022-05-13 NOTE — Telephone Encounter (Signed)
Spoke with pt who states [employer] is requiring he bring a note stating that C-Pap was d/c'd with an order and pt must bring that order with him when returns that machine. Attempted to call [employer] but was placed on hold. Pt states he would gladly pick up OV note from [place] office and take it with him to [facility] when he returns the machine. Margie could you please print OV note from 05/11/22 written by Ames Dura and let pt know when and where he can pick it up? Thank you

## 2022-05-13 NOTE — Telephone Encounter (Signed)
Synetta Fail, can you help with this? Order was placed 05/11/2022 to d/c cpap.

## 2022-05-13 NOTE — Telephone Encounter (Signed)
I sent urgent message to [employer] asking them to check on this issue 

## 2022-05-14 NOTE — Telephone Encounter (Signed)
Rec'd message from [place] with [employer] "I entered the pickup ticket yesterday and stopped the billing based off the date of the order and sent it to scheduling . It  was pushed to retail and patient was contact and will bring his cpap into office per note on account dated 05-12-22 1211pm.  Looks like they spoke to him already."

## 2022-05-26 ENCOUNTER — Encounter: Payer: 59 | Admitting: Skilled Nursing Facility1

## 2022-06-23 ENCOUNTER — Encounter: Payer: Self-pay | Admitting: Skilled Nursing Facility1

## 2022-06-23 ENCOUNTER — Encounter: Payer: 59 | Attending: Nurse Practitioner | Admitting: Skilled Nursing Facility1

## 2022-06-23 DIAGNOSIS — E669 Obesity, unspecified: Secondary | ICD-10-CM | POA: Diagnosis not present

## 2022-06-23 NOTE — Progress Notes (Signed)
Bariatric Nutrition Follow-Up Visit Medical Nutrition Therapy    NUTRITION ASSESSMENT    Surgery date: 09/09/2021 Surgery type: sleeve Start weight at [facility]: 282 Weight today: virtual appt; pt identified by name and DOB pt agreeable to limitations of this visit type  Clinical  Medical hx: anxiety, depression  Medications: N/A  Labs: vitamin D 16, LDL 115 Notable signs/symptoms: headaches a couple times a week Any previous deficiencies? No   Body Composition Scale 09/23/2021  Current Body Weight 258.8  Total Body Fat % 31.7  Visceral Fat 22  Fat-Free Mass % 68.2   Total Body Water % 49.2  Muscle-Mass lbs 48.7  BMI 37.1  Body Fat Displacement          Torso  lbs 50.9         Left Leg  lbs 10.1         Right Leg  lbs 10.1         Left Arm  lbs 5.0         Right Arm   lbs 5.0     Lifestyle & Dietary Hx   Pt states he has no complaints on his energy level but states he sometimes needs a nap stating he wakes early and goes to bed late getting about 5-6 hours of sleep each night.  Pt states he is Constantly watching what he is eating trying to stay away from carbs.  Pt states since adding in some more carbs he is not having dizziness/ligthheadedness at work as often still experiencing those symptoms about 1-2 time a week.  Pt states it is hard to eat breakfast because he has never been a breakfast eater.  Pt states he does not eat fried food.  Pt states Some weeks he works 2 days some 7 days 84 hour pay checks but latly 100-120 hours.   Estimated daily fluid intake: 72 (guess) Estimated daily protein intake: unknown g Supplements: multivitamin  Current average weekly physical activity: work   24-Hr Dietary Recall First Meal 6am: coffee and vitamins Snack: peanut butter crackers or saltines Second Meal 12: salad: NO dressing, lettuce, tomato, cucumber, carrots + chicken Snack:  peanut butter crackers Third Meal 8pm: ham and cheese on high fiber wrap maybe 1-2 Snack:   Beverages: water, coffee (guessed 16-40ounces 2-5 days a week), zero sugar monster (2-5 a week)  Post-Op Goals/ Signs/ Symptoms Using straws: no Drinking while eating: no Chewing/swallowing difficulties: no Changes in vision: no Changes to mood/headaches: no Hair loss/changes to skin/nails: no Difficulty focusing/concentrating: no Sweating: no Limb weakness: no Dizziness/lightheadedness: no Palpitations: no  Carbonated/caffeinated beverages: no N/V/D/C/Gas: constipation using MiraLax which helps Abdominal pain: no Dumping syndrome: no    NUTRITION DIAGNOSIS  Overweight/obesity ([place]-3.3) related to past poor dietary habits and physical inactivity as evidenced by completed bariatric surgery and following dietary guidelines for continued weight loss and healthy nutrition status.     NUTRITION INTERVENTION Nutrition counseling (C-1) and education (E-2) to facilitate bariatric surgery goals, including: The importance of consuming adequate calories as well as certain nutrients daily due to the body's need for essential vitamins, minerals, and fats The importance of daily physical activity and to reach a goal of at least 150 minutes of moderate to vigorous physical activity weekly (or as directed by their physician) due to benefits such as increased musculature and improved lab values The importance of intuitive eating specifically learning hunger-satiety cues and understanding the importance of learning a new body: The importance of mindful eating to  avoid grazing behaviors  Importance of vegetables To have an overall healthy diet, adult men and women are recommended to consume anywhere from 2-3 cups of vegetables daily. Vegetables provide a wide range of vitamins and minerals such as vitamin A, vitamin C, potassium, and folic acid. According to the [employer], including fruit and vegetables daily may reduce the risk of cardiovascular disease, certain cancers, and other  non-communicable diseases. Why you need complex carbohydrates: Whole grains and other complex carbohydrates are required to have a healthy diet. Whole grains provide fiber which can help with blood glucose levels and help keep you satiated. Fruits and starchy vegetables provide essential vitamins and minerals required for immune function, eyesight support, brain support, bone density, wound healing and many other functions within the body. According to the current evidenced based 2020-2025 Dietary Guidelines for Americans, complex carbohydrates are part of a healthy eating pattern which is associated with a decreased risk for type 2 diabetes, cancers, and cardiovascular disease.  Encouraged patient to honor their body's internal hunger and fullness cues.  Throughout the day, check in mentally and rate hunger. Stop eating when satisfied not full regardless of how much food is left on the plate.  Get more if still hungry 20-30 minutes later.  The key is to honor satisfaction so throughout the meal, rate fullness factor and stop when comfortably satisfied not physically full. The key is to honor hunger and fullness without any feelings of guilt or shame.  Pay attention to what the internal cues are, rather than any external factors. This will enhance the confidence you have in listening to your own body and following those internal cues enabling you to increase how often you eat when you are hungry not out of appetite and stop when you are satisfied not full.  Encouraged pt to continue to eat balanced meals inclusive of non starchy vegetables 2 times a day 7 days a week Encouraged pt to choose lean protein sources: limiting beef, pork, sausage, hotdogs, and lunch meat Encourage pt to choose healthy fats such as plant based limiting animal fats Encouraged pt to continue to drink a minium 64 fluid ounces with half being plain water to satisfy proper hydration  Get back in with a mental health professional Add  complex carbohydrates each day   Handouts Previously Provided Include  Phase 7  Learning Style & Readiness for Change Teaching method utilized: Visual & Auditory  Demonstrated degree of understanding via: Teach Back  Readiness Level: action Barriers to learning/adherence to lifestyle change: disordered eating  RD's Notes for Next Visit Assess adherence to pt chosen goals     MONITORING & EVALUATION Dietary intake, weekly physical activity, body weight  Next Steps Patient is to follow-up in 6 weeks

## 2022-11-06 IMAGING — RF DG UGI W/ HIGH DENSITY W/O KUB
13 of 16 series · 15 of 19 positions shown · non-contrast
Comparison: None.

CLINICAL DATA: Morbid obesity, preop gastric sleeve procedure

EXAM:
UPPER GI SERIES WITH KUB
TECHNIQUE: After obtaining a scout radiograph a routine upper GI series was
performed using thin and high density barium.
FLUOROSCOPY TIME:  Fluoroscopy Time:  0.4 minute
Radiation Exposure Index (if provided by the fluoroscopic device):
5.6 mGy
Number of Acquired Spot Images: 0

[Series 2: t abdomen supine · 0.14mm/px · 1 of 1 slices shown (1 of 2)]
[im 1/1]
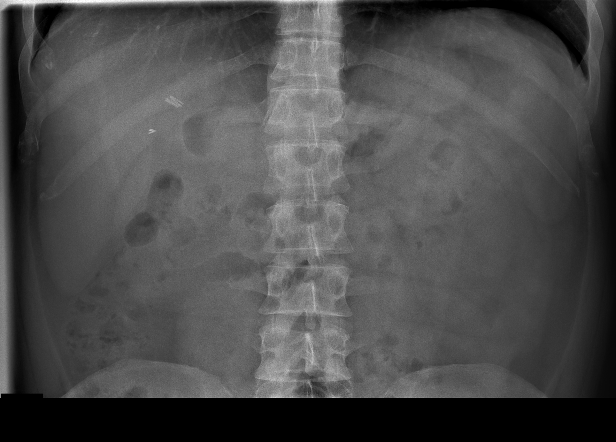

[Series 3: t abdomen supine · 0.14mm/px · 1 of 1 slices shown (2 of 2)]
[im 1/1]
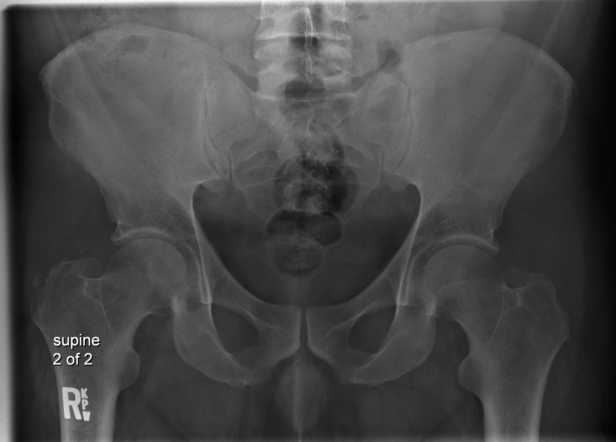

[Series 5: cp_standard · 0.25mm/px · 1 of 1 slices shown (1 of 11)]
[im 1/1]
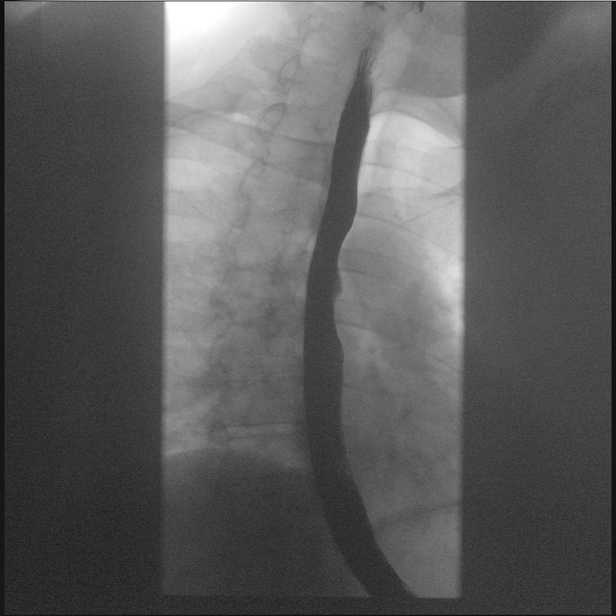

[Series 6: cp_standard · 0.25mm/px · 3 of 25 frames shown (2 of 11)]
[frame 4/25]
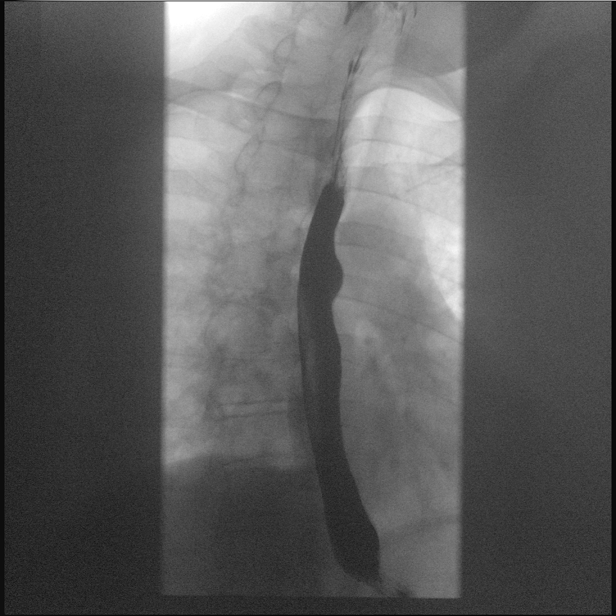
[frame 13/25]
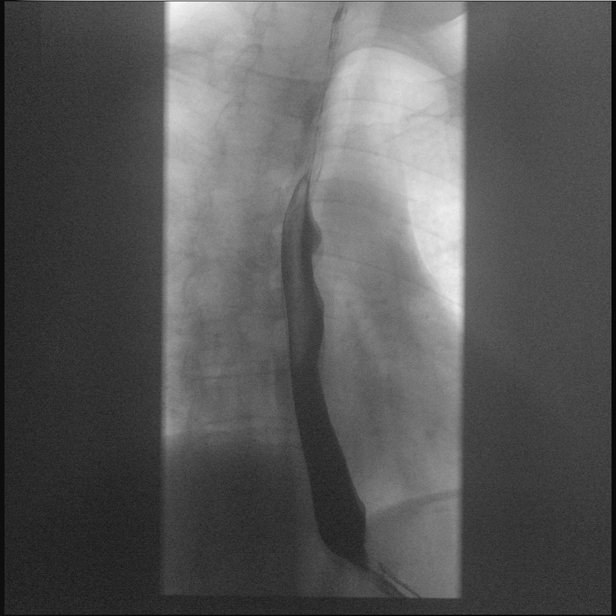
[frame 15/25]
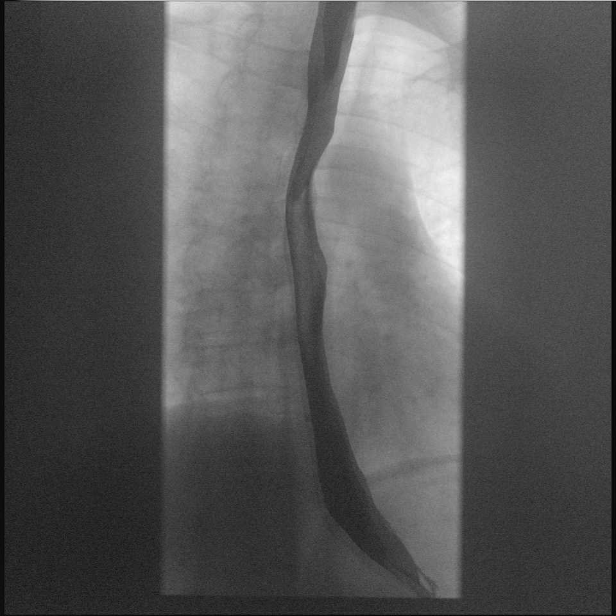

[Series 7: cp_standard · 0.25mm/px · 1 of 1 slices shown (3 of 11)]
[im 1/1]
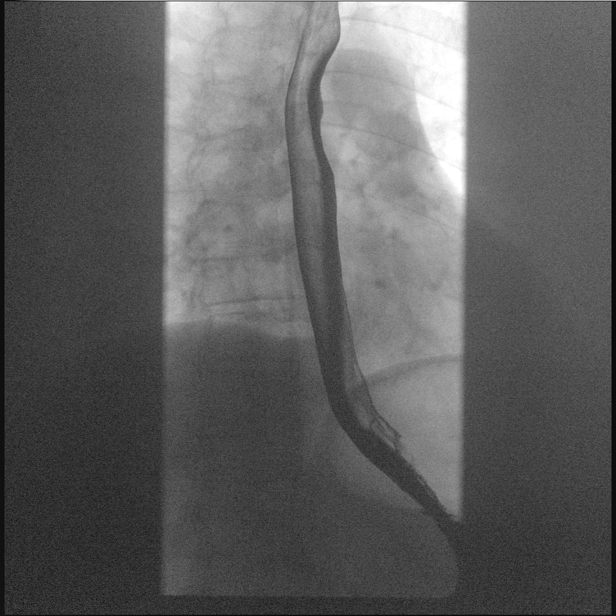

[Series 8: cp_standard · 0.26mm/px · 1 of 1 slices shown (4 of 11)]
[im 1/1]
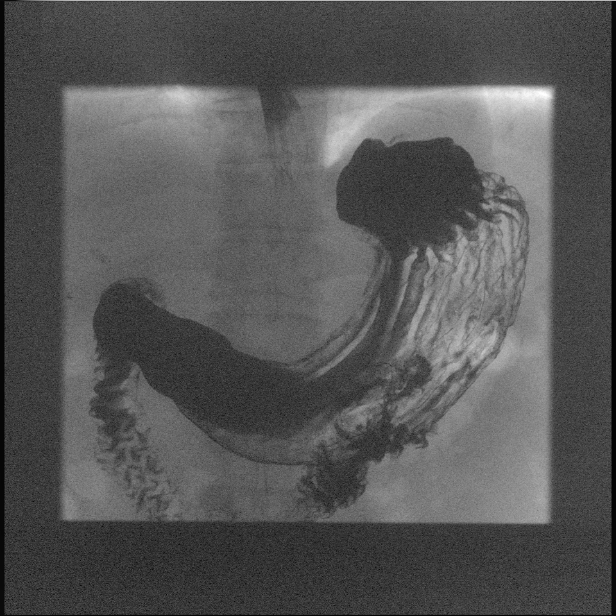

[Series 9: cp_standard · 0.26mm/px · 1 of 1 slices shown (5 of 11)]
[im 1/1]
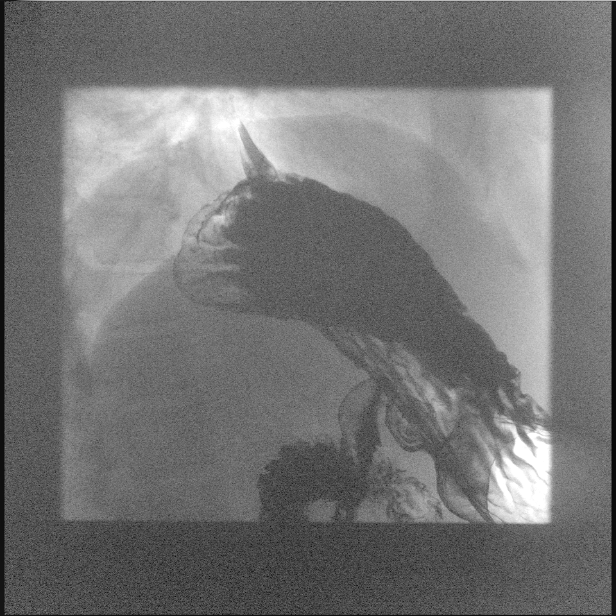

[Series 11: cp_standard · 0.26mm/px · 1 of 1 slices shown (6 of 11)]
[im 1/1]
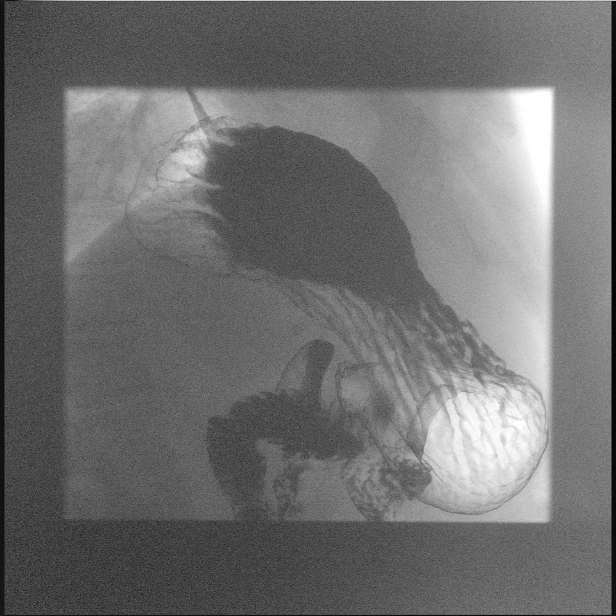

[Series 12: cp_standard · 0.26mm/px · 1 of 1 slices shown (7 of 11)]
[im 1/1]
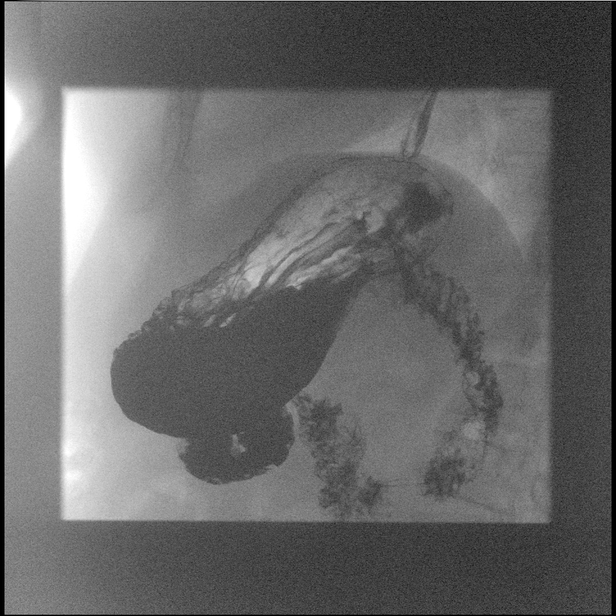

[Series 13: cp_standard · 0.26mm/px · 1 of 1 slices shown (8 of 11)]
[im 1/1]
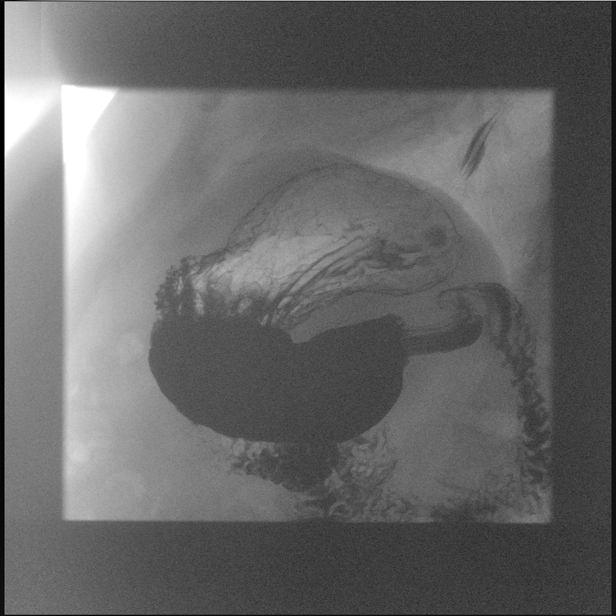

[Series 14: cp_standard · 0.26mm/px · 1 of 1 slices shown (9 of 11)]
[im 1/1]
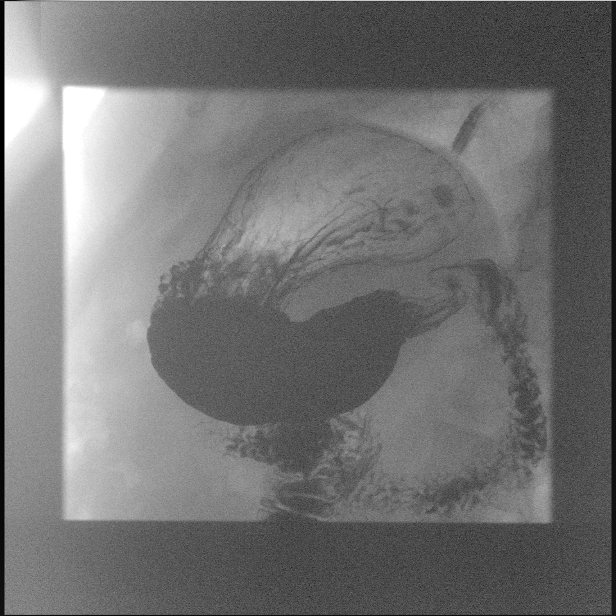

[Series 16: cp_standard · 0.26mm/px · 1 of 1 slices shown (10 of 11)]
[im 1/1]
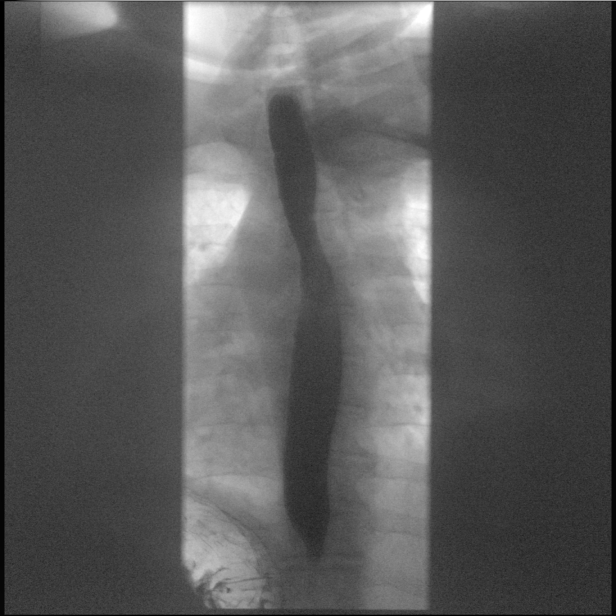

[Series 17: cp_standard · 0.26mm/px · 1 of 1 slices shown (11 of 11)]
[im 1/1]
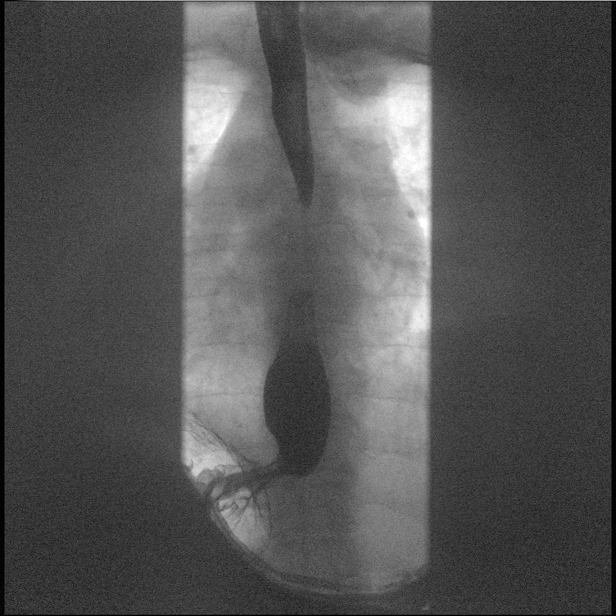

[15 of 19 positions shown; findings below may reference images not displayed]

FINDINGS: KUB:

No bowel dilatation to suggest obstruction. No evidence of
pneumoperitoneum, portal venous gas or pneumatosis.

No pathologic calcifications along the expected course of the
ureters.

No acute osseous abnormality.

UPPER GI SERIES:

Examination of the esophagus demonstrated normal esophageal
motility. Normal esophageal morphology without evidence of
esophagitis or ulceration. No esophageal stricture, diverticula, or
mass lesion. No evidence of hiatal hernia. Mild gastroesophageal
reflux.

Examination of the stomach demonstrated normal rugal folds and areae
gastricae. Gastric mucosa appeared unremarkable without evidence of
ulceration, scarring, or mass lesion. Gastric motility and emptying
was normal. Fluoroscopic examination of the duodenum demonstrates
normal motility and morphology without evidence of ulceration or
mass lesion.
IMPRESSION: 1. Mild gastroesophageal reflux.
2. Otherwise, normal upper GI.

## 2022-12-23 ENCOUNTER — Encounter: Payer: 59 | Admitting: Nurse Practitioner

## 2023-01-14 ENCOUNTER — Encounter: Payer: 59 | Admitting: Nurse Practitioner

## 2023-01-18 NOTE — Progress Notes (Unsigned)
Name: Nicholas Haney   MRN: [id number]    DOB: 12-Apr-1981   Date:01/19/2023       Progress Note  Subjective  Chief Complaint  Chief Complaint  Patient presents with   Annual Exam    HPI  Patient presents for annual CPE .  IPSS Questionnaire (AUA-7): Over the past month.   1)  How often have you had a sensation of not emptying your bladder completely after you finish urinating?  0 - Not at all  2)  How often have you had to urinate again less than two hours after you finished urinating? 0 - Not at all  3)  How often have you found you stopped and started again several times when you urinated?  0 - Not at all  4) How difficult have you found it to postpone urination?  0 - Not at all  5) How often have you had a weak urinary stream?  0 - Not at all  6) How often have you had to push or strain to begin urination?  0 - Not at all  7) How many times did you most typically get up to urinate from the time you went to bed until the time you got up in the morning?  0 - None  Total score:  0-7 mildly symptomatic   8-19 moderately symptomatic   20-35 severely symptomatic     Diet:small portions,  Low Carb, no sugar, water since Gastric Bypass April 2023 Exercise: 4 times a week 60 minutes Last Dental Exam: April 2024 Last Eye Exam: NA  Depression: phq 9 is positive, diagnosed with PTSD and depression, was [facility]    01/19/2023    8:47 AM 07/07/2021   10:30 AM 05/06/2021    9:16 AM 01/23/2019    9:50 AM  Depression screen PHQ 2/9  Decreased Interest 2 0 1 3  Down, Depressed, Hopeless 2 0 0 3  PHQ - 2 Score 4 0 1 6  Altered sleeping 0  0 2  Tired, decreased energy 1  0 2  Change in appetite 0  0 0  Feeling bad or failure about yourself  1  0 3  Trouble concentrating 1  0 3  Moving slowly or fidgety/restless 1  0 1  Suicidal thoughts 0  0 1  PHQ-9 Score 8  1 18   Difficult doing work/chores Somewhat difficult  Not difficult at all Very difficult    Hypertension:   BP Readings from Last 3 Encounters:  01/19/23 118/76  09/23/21 128/80  09/10/21 99/64    Obesity: Wt Readings from Last 3 Encounters:  01/19/23 226 lb 4.8 oz (102.6 kg)  09/24/21 258 lb 12.8 oz (117.4 kg)  09/23/21 262 lb 3.2 oz (118.9 kg)   BMI Readings from Last 3 Encounters:  01/19/23 32.94 kg/m  09/24/21 37.13 kg/m  09/23/21 37.62 kg/m     Lipids:  No results found for: "CHOL" No results found for: "HDL" No results found for: "LDLCALC" No results found for: "TRIG" No results found for: "CHOLHDL" No results found for: "LDLDIRECT" Glucose:  Glucose  Date Value Ref Range Status  06/27/2014 87 65 - 99 mg/dL Final  42/59/5638 756 (H) 65 - 99 mg/dL Final  43/32/9518 841 (H) 65 - 99 mg/dL Final   Glucose, Bld  Date Value Ref Range Status  08/27/2021 107 (H) 70 - 99 mg/dL Final    Comment:    Glucose reference range applies only to samples taken  after fasting for at least 8 hours.    Flowsheet Row Office Visit from 01/19/2023 in [facility]  AUDIT-C Score 0       Married STD testing and prevention (HIV/chl/gon/syphilis):  ordered Sexual history: yes Hep C Screening: ordered Skin cancer: Discussed monitoring for atypical lesions Colorectal cancer: does not qualify Prostate cancer:  no No results found for: "PSA"   Lung cancer:  Low Dose CT Chest recommended if Age 91-80 years, 30 pack-year currently smoking OR have quit w/in 15years. Patient  no a candidate for screening   AAA: The USPSTF recommends one-time screening with ultrasonography in men ages 74 to 75 years who have ever smoked. Patient   no, a candidate for screening  ECG:  06/04/2021  Vaccines:  HPV: up to at age 67 , ask insurance if age between 8-45  Shingrix: 20-64 yo and ask insurance if covered when patient above 13 yo Pneumonia:  educated and discussed with patient. Flu:  educated and discussed with patient.    Advanced Care Planning: A voluntary discussion  about advance care planning including the explanation and discussion of advance directives.  Discussed health care proxy and Living will, and the patient was able to identify a health care proxy as wife, Lemar Lofty.  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   Loud snoring 06/27/2021   Chronic pain 05/06/2021   Cannabis dependence (HCC) 05/06/2021   Pilonidal cyst without abscess 05/06/2021   Fatty liver 05/06/2021   Morbid obesity with BMI of 40.0-44.9, adult (HCC) 05/06/2021   PTSD (post-traumatic stress disorder) 01/23/2019   History of tobacco use 01/23/2019   History of 2019 novel coronavirus disease (COVID-19) 01/23/2019   Depressive disorder    Anxiety    Alcohol use disorder in remission 12/07/2018    Past Surgical History:  Procedure Laterality Date   BACK SURGERY     CHOLECYSTECTOMY     GALLBLADDER SURGERY     herniated disc     UPPER GI ENDOSCOPY N/A 09/09/2021   Procedure: UPPER GI ENDOSCOPY;  Surgeon: Luretha Murphy, MD;  Location: [facility];  Service: General;  Laterality: N/A;    History reviewed. No pertinent family history.  Social History   Socioeconomic History   Marital status: Married    Spouse name: chandra   Number of children: 2   Years of education: Not on file   Highest education level: 12th grade  Occupational History   Not on file  Tobacco Use   Smoking status: Former    Current packs/day: 0.00    Types: Cigarettes    Quit date: 10/31/2018    Years since quitting: 4.2   Smokeless tobacco: Never  Vaping Use   Vaping status: Never Used  Substance and Sexual Activity   Alcohol use: Not Currently    Comment: stopped May 25   Drug use: No   Sexual activity: Yes  Other Topics Concern   Not on file  Social History Narrative   Not on file   Social Determinants of Health    Financial Resource Strain: Low Risk  (01/19/2023)   Overall Financial Resource Strain (CARDIA)    Difficulty of Paying Living Expenses: Not hard at all  Food Insecurity: No Food Insecurity (01/19/2023)   Hunger Vital Sign    Worried About  Running Out of Food in the Last Year: Never true    Ran Out of Food in the Last Year: Never true  Recent Concern: Food Insecurity - Food Insecurity Present (01/19/2023)   Hunger Vital Sign    Worried About Running Out of Food in the Last Year: Sometimes true    Ran Out of Food in the Last Year: Never true  Transportation Needs: No Transportation Needs (01/19/2023)   PRAPARE - Administrator, Civil Service (Medical): No    Lack of Transportation (Non-Medical): No  Physical Activity: Sufficiently Active (01/19/2023)   Exercise Vital Sign    Days of Exercise per Week: 6 days    Minutes of Exercise per Session: 40 min  Stress: Stress Concern Present (01/19/2023)   [employer] of Occupational Health - Occupational Stress Questionnaire    Feeling of Stress : To some extent  Social Connections: Socially Isolated (01/19/2023)   Social Connection and Isolation Panel [NHANES]    Frequency of Communication with Friends and Family: Never    Frequency of Social Gatherings with Friends and Family: Never    Attends Religious Services: Never    Database administrator or Organizations: No    Attends Engineer, structural: Not on file    Marital Status: Married  Catering manager Violence: Not At Risk (01/19/2023)   Humiliation, Afraid, Rape, and Kick questionnaire    Fear of Current or Ex-Partner: No    Emotionally Abused: No    Physically Abused: No    Sexually Abused: No    No current outpatient medications on file.  No Known Allergies   ROS  Constitutional: Negative for fever or weight change.  Respiratory: Negative for cough and shortness of breath.   Cardiovascular: Negative for chest pain or palpitations.  Gastrointestinal:  Negative for abdominal pain, no bowel changes.  Musculoskeletal: Negative for gait problem or joint swelling.  Skin: Negative for rash.  Neurological: Negative for dizziness or headache.  No other specific complaints in a complete review of systems (except as listed in HPI above).    Objective  Vitals:   01/19/23 0844  BP: 118/76  Pulse: 66  Resp: 16  Temp: 98.1 F (36.7 C)  TempSrc: Oral  SpO2: 98%  Weight: 226 lb 4.8 oz (102.6 kg)  Height: 5' 9.5" (1.765 m)    Body mass index is 32.94 kg/m.  Physical Exam Constitutional: Patient appears well-developed and well-nourished. No distress.  HENT: Head: Normocephalic and atraumatic. Ears: B TMs ok, no erythema or effusion; Nose: Nose normal. Mouth/Throat: Oropharynx is clear and moist. No oropharyngeal exudate.  Eyes: Conjunctivae and EOM are normal. Pupils are equal, round, and reactive to light. No scleral icterus.  Neck: Normal range of motion. Neck supple. No JVD present. No thyromegaly present.  Cardiovascular: Normal rate, regular rhythm and normal heart sounds.  No murmur heard. No BLE edema. Pulmonary/Chest: Effort normal and breath sounds normal. No respiratory distress. Abdominal: Soft. Bowel sounds are normal, no distension. There is no tenderness. no masses Musculoskeletal: Normal range of motion, no joint effusions. No gross deformities Neurological: he is alert and oriented to person, place, and time. No cranial nerve deficit. Coordination, balance, strength, speech and gait are normal.  Skin: Skin is warm and dry. No rash noted. No erythema.  Psychiatric: Patient has a normal mood and affect. behavior is normal. Judgment and thought content normal.   No results found for this or any previous visit (from the past 2160 hour(s)).  Fall Risk:    01/19/2023    8:47 AM 07/07/2021   10:29 AM 05/06/2021    9:16 AM 01/23/2019    9:50 AM  Fall Risk   Falls in the past year? 0 0 0 0  Number falls in past yr: 0  0 0   Injury with Fall? 0  0 0  Follow up    Falls evaluation completed     Functional Status Survey: Is the patient deaf or have difficulty hearing?: No Does the patient have difficulty seeing, even when wearing glasses/contacts?: No Does the patient have difficulty concentrating, remembering, or making decisions?: No Does the patient have difficulty walking or climbing stairs?: No Does the patient have difficulty dressing or bathing?: No Does the patient have difficulty doing errands alone such as visiting a doctor's office or shopping?: No    Assessment & Plan  1. Annual physical exam  - CBC with Differential/Platelet - COMPLETE METABOLIC PANEL WITH GFR - Lipid panel - Hemoglobin A1c - Hepatitis C antibody  2. Screening for diabetes mellitus  - COMPLETE METABOLIC PANEL WITH GFR - Hemoglobin A1c  3. Screening for deficiency anemia  - CBC with Differential/Platelet  4. Screening for cholesterol level  - Lipid panel  5. Encounter for hepatitis C screening test for low risk patient  - Hepatitis C antibody    -Prostate cancer screening and PSA options (with potential risks and benefits of testing vs not testing) were discussed along with recent recs/guidelines. -USPSTF grade A and B recommendations reviewed with patient; age-appropriate recommendations, preventive care, screening tests, etc discussed and encouraged; healthy living encouraged; see AVS for patient education given to patient -Discussed importance of 150 minutes of physical activity weekly, eat two servings of fish weekly, eat one serving of tree nuts ( cashews, pistachios, pecans, almonds.Marland Kitchen) every other day, eat 6 servings of fruit/vegetables daily and drink plenty of water and avoid sweet beverages.  -Reviewed Health Maintenance: yes

## 2023-01-19 ENCOUNTER — Other Ambulatory Visit: Payer: Self-pay

## 2023-01-19 ENCOUNTER — Ambulatory Visit (INDEPENDENT_AMBULATORY_CARE_PROVIDER_SITE_OTHER): Payer: 59 | Admitting: Nurse Practitioner

## 2023-01-19 VITALS — BP 118/76 | HR 66 | Temp 98.1°F | Resp 16 | Ht 69.5 in | Wt 226.3 lb

## 2023-01-19 DIAGNOSIS — Z13220 Encounter for screening for lipoid disorders: Secondary | ICD-10-CM | POA: Diagnosis not present

## 2023-01-19 DIAGNOSIS — Z131 Encounter for screening for diabetes mellitus: Secondary | ICD-10-CM

## 2023-01-19 DIAGNOSIS — Z0000 Encounter for general adult medical examination without abnormal findings: Secondary | ICD-10-CM | POA: Diagnosis not present

## 2023-01-19 DIAGNOSIS — Z1159 Encounter for screening for other viral diseases: Secondary | ICD-10-CM

## 2023-01-19 DIAGNOSIS — Z130 Encounter for screening for diseases of the blood and blood-forming organs and certain disorders involving the immune mechanism: Secondary | ICD-10-CM

## 2023-01-19 LAB — CBC WITH DIFFERENTIAL/PLATELET
Absolute Monocytes: 458 cells/uL (ref 200–950)
Basophils Absolute: 21 cells/uL (ref 0–200)
Basophils Relative: 0.4 %
Eosinophils Absolute: 52 cells/uL (ref 15–500)
Eosinophils Relative: 1 %
HCT: 43 % (ref 38.5–50.0)
Hemoglobin: 14.6 g/dL (ref 13.2–17.1)
Lymphs Abs: 1836 cells/uL (ref 850–3900)
MCH: 30.4 pg (ref 27.0–33.0)
MCHC: 34 g/dL (ref 32.0–36.0)
MCV: 89.6 fL (ref 80.0–100.0)
MPV: 11.1 fL (ref 7.5–12.5)
Monocytes Relative: 8.8 %
Neutro Abs: 2834 cells/uL (ref 1500–7800)
Neutrophils Relative %: 54.5 %
Platelets: 234 10*3/uL (ref 140–400)
RBC: 4.8 10*6/uL (ref 4.20–5.80)
RDW: 11.9 % (ref 11.0–15.0)
Total Lymphocyte: 35.3 %
WBC: 5.2 10*3/uL (ref 3.8–10.8)

## 2023-03-12 ENCOUNTER — Ambulatory Visit: Payer: Self-pay

## 2023-03-12 ENCOUNTER — Telehealth: Payer: 59 | Admitting: Physician Assistant

## 2023-03-12 DIAGNOSIS — T733XXA Exhaustion due to excessive exertion, initial encounter: Secondary | ICD-10-CM | POA: Diagnosis not present

## 2023-03-12 NOTE — Telephone Encounter (Signed)
     Chief Complaint: Reports has had depression/anxiety in the past. Has a lot of stress at work. Symptoms: Above Frequency: Awhile Pertinent Negatives: Patient denies thoughts of self harm Disposition: [] ED /[] Urgent Care (no appt availability in office) / [x] Appointment(In office/virtual)/ []  [place] [facility]/ [] Home Care/ [] Refused Recommended Disposition /[] [place]/ []  Follow-up with PCP Additional Notes: Pt. Agrees with appointment. Will go to ED for worsening of symptoms.  Reason for Disposition  [1] Depression AND [2] worsening (e.g., sleeping poorly, less able to do activities of daily living)  Answer Assessment - Initial Assessment Questions 1. CONCERN: "What happened that made you call today?"     Depression, anxiety 2. DEPRESSION SYMPTOM SCREENING: "How are you feeling overall?" (e.g., decreased energy, increased sleeping or difficulty sleeping, difficulty concentrating, feelings of sadness, guilt, hopelessness, or worthlessness)     Depressed, roller coaster 3. RISK OF HARM - SUICIDAL IDEATION:  "Do you ever have thoughts of hurting or killing yourself?"  (e.g., yes, no, no but preoccupation with thoughts about death)   - INTENT:  "Do you have thoughts of hurting or killing yourself right NOW?" (e.g., yes, no, N/A)   - PLAN: "Do you have a specific plan for how you would do this?" (e.g., gun, knife, overdose, no plan, N/A)     At times, not today 4. RISK OF HARM - HOMICIDAL IDEATION:  "Do you ever have thoughts of hurting or killing someone else?"  (e.g., yes, no, no but preoccupation with thoughts about death)   - INTENT:  "Do you have thoughts of hurting or killing someone right NOW?" (e.g., yes, no, N/A)   - PLAN: "Do you have a specific plan for how you would do this?" (e.g., gun, knife, no plan, N/A)      No 5. FUNCTIONAL IMPAIRMENT: "How have things been going for you overall? Have you had more difficulty than usual doing your normal daily  activities?"  (e.g., better, same, worse; self-care, school, work, interactions)     Not sleeping well 6. SUPPORT: "Who is with you now?" "Who do you live with?" "Do you have family or friends who you can talk to?"      Wife 7. THERAPIST: "Do you have a counselor or therapist? Name?"     No 8. STRESSORS: "Has there been any new stress or recent changes in your life?"     No 9. ALCOHOL USE OR SUBSTANCE USE (DRUG USE): "Do you drink alcohol or use any illegal drugs?"     No 10. OTHER: "Do you have any other physical symptoms right now?" (e.g., fever)       Fatigue 11. PREGNANCY: "Is there any chance you are pregnant?" "When was your last menstrual period?"       No  Protocols used: Depression-A-AH

## 2023-03-12 NOTE — Patient Instructions (Signed)
[name], thank you for joining [name], PA-C for today's virtual visit.  While this provider is not your primary care provider (PCP), if your PCP is located in our provider database this encounter information will be shared with them immediately following your visit.   A [facility] MyChart account gives you access to today's visit and all your visits, tests, and labs performed at [facility] " click here if you don't have a [facility] MyChart account or go to [email or web address]  Consent: (Patient) Nicholas Haney provided verbal consent for this virtual visit at the beginning of the encounter.  Current Medications: No current outpatient medications on file.   Medications ordered in this encounter:  No orders of the defined types were placed in this encounter.    *If you need refills on other medications prior to your next appointment, please contact your pharmacy*  Follow-Up: Call back or seek an in-person evaluation if the symptoms worsen or if the condition fails to improve as anticipated.  [facility] [phone]  Other Instructions Fatigue If you have fatigue, you feel tired all the time and have a lack of energy or a lack of motivation. Fatigue may make it difficult to start or complete tasks because of exhaustion. Occasional or mild fatigue is often a normal response to activity or life. However, long-term (chronic) or extreme fatigue may be a symptom of a medical condition such as: Depression. Not having enough red blood cells or hemoglobin in the blood (anemia). A problem with a small gland located in the lower front part of the neck (thyroid disorder). Rheumatologic conditions. These are problems related to the body's defense system (immune system). Infections, especially certain viral infections. Fatigue can also lead to negative health outcomes over time. Follow these instructions at home: Medicines Take  over-the-counter and prescription medicines only as told by your health care provider. Take a multivitamin if told by your health care provider. Do not use herbal or dietary supplements unless they are approved by your health care provider. Eating and drinking  Avoid heavy meals in the evening. Eat a well-balanced diet, which includes lean proteins, whole grains, plenty of fruits and vegetables, and low-fat dairy products. Avoid eating or drinking too many products with caffeine in them. Avoid alcohol. Drink enough fluid to keep your urine pale yellow. Activity  Exercise regularly, as told by your health care provider. Use or practice techniques to help you relax, such as yoga, tai chi, meditation, or massage therapy. Lifestyle Change situations that cause you stress. Try to keep your work and personal schedules in balance. Do not use recreational or illegal drugs. General instructions Monitor your fatigue for any changes. Go to bed and get up at the same time every day. Avoid fatigue by pacing yourself during the day and getting enough sleep at night. Maintain a healthy weight. Contact a health care provider if: Your fatigue does not get better. You have a fever. You suddenly lose or gain weight. You have headaches. You have trouble falling asleep or sleeping through the night. You feel angry, guilty, anxious, or sad. You have swelling in your legs or another part of your body. Get help right away if: You feel confused, feel like you might faint, or faint. Your vision is blurry or you have a severe headache. You have severe pain in your abdomen, your back, or the area between your waist and hips (pelvis). You have chest pain, shortness of breath, or an  irregular or fast heartbeat. You are unable to urinate, or you urinate less than normal. You have abnormal bleeding from the rectum, nose, lungs, nipples, or, if you are male, the vagina. You vomit blood. You have thoughts  about hurting yourself or others. These symptoms may be an emergency. Get help right away. Call 911. Do not wait to see if the symptoms will go away. Do not drive yourself to the hospital. Get help right away if you feel like you may hurt yourself or others, or have thoughts about taking your own life. Go to your nearest emergency room or: Call 911. Call the National Suicide Prevention Lifeline at 904-810-4199 or 988. This is open 24 hours a day. Text the Crisis Text Line at (917)511-8088. Summary If you have fatigue, you feel tired all the time and have a lack of energy or a lack of motivation. Fatigue may make it difficult to start or complete tasks because of exhaustion. Long-term (chronic) or extreme fatigue may be a symptom of a medical condition. Exercise regularly, as told by your health care provider. Change situations that cause you stress. Try to keep your work and personal schedules in balance. This information is not intended to replace advice given to you by your health care provider. Make sure you discuss any questions you have with your health care provider. Document Revised: 03/17/2021 Document Reviewed: 03/17/2021 Elsevier Patient Education  2024 Elsevier Inc.    If you have been instructed to have an in-person evaluation today at a local Urgent Care facility, please use the link below. It will take you to a list of all of our available [facility], including address, phone number and hours of operation. Please do not delay care.  [facility]  If you or a family member do not have a primary care provider, use the link below to schedule a visit and establish care. When you choose a [facility] primary care physician or advanced practice provider, you gain a long-term partner in health. Find a Primary Care Provider  Learn more about [facility]'s in-office and virtual care options: [facility] - Get Care Now

## 2023-03-12 NOTE — Progress Notes (Signed)
Virtual Visit Consent   Nicholas Haney, you are scheduled for a virtual visit with a [facility] provider today. Just as with appointments in the office, your consent must be obtained to participate. Your consent will be active for this visit and any virtual visit you may have with one of our providers in the next 365 days. If you have a MyChart account, a copy of this consent can be sent to you electronically.  As this is a virtual visit, video technology does not allow for your provider to perform a traditional examination. This may limit your provider's ability to fully assess your condition. If your provider identifies any concerns that need to be evaluated in person or the need to arrange testing (such as labs, EKG, etc.), we will make arrangements to do so. Although advances in technology are sophisticated, we cannot ensure that it will always work on either your end or our end. If the connection with a video visit is poor, the visit may have to be switched to a telephone visit. With either a video or telephone visit, we are not always able to ensure that we have a secure connection.  By engaging in this virtual visit, you consent to the provision of healthcare and authorize for your insurance to be billed (if applicable) for the services provided during this visit. Depending on your insurance coverage, you may receive a charge related to this service.  I need to obtain your verbal consent now. Are you willing to proceed with your visit today? Nicholas Haney has provided verbal consent on 03/12/2023 for a virtual visit (video or telephone). [name], PA-C  Date: 03/12/2023 2:30 PM  Virtual Visit via Video Note   I, [name], connected with  Nicholas Haney  ([id number], 1981/05/28) on 03/12/23 at  2:30 PM EDT by a video-enabled telemedicine application and verified that I am speaking with the correct person using two identifiers.  Location: Patient: Virtual  Visit Location Patient: Home Provider: Virtual Visit Location Provider: Home Office   I discussed the limitations of evaluation and management by telemedicine and the availability of in person appointments. The patient expressed understanding and agreed to proceed.    History of Present Illness: Nicholas Haney is a 42 y.o. who identifies as a male who was assigned male at birth, and is being seen today for flu-like symptoms.  HPI: URI  This is a new problem. The current episode started 1 to 4 weeks ago (about a week for fatigue and body aches). The problem has been gradually worsening. There has been no fever. Associated symptoms include headaches (migraine). Pertinent negatives include no congestion, coughing, diarrhea, ear pain, joint pain, plugged ear sensation, sinus pain or sore throat. Associated symptoms comments: Myalgias, fatigue. He has tried acetaminophen, NSAIDs and increased fluids for the symptoms. The treatment provided no relief.    PMH: migraines Sometimes gets fatigue and body aches with migraines.  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  Problems:  Patient Active Problem List   Diagnosis Date Noted   Severe sleep apnea 09/23/2021   S/P laparoscopic sleeve gastrectomy 09/09/2021   Loud snoring 06/27/2021   Chronic pain 05/06/2021   Cannabis dependence (HCC) 05/06/2021   Pilonidal cyst without abscess 05/06/2021   Fatty liver 05/06/2021   Morbid obesity with BMI of 40.0-44.9, adult (HCC) 05/06/2021   PTSD (post-traumatic stress disorder) 01/23/2019   History of tobacco use 01/23/2019   History of 2019 novel coronavirus disease (  COVID-19) 01/23/2019   Depressive disorder    Anxiety    Alcohol use disorder in remission 12/07/2018    Allergies: No Known Allergies Medications: No current outpatient medications on file.  Observations/Objective: Patient is well-developed, well-nourished in no acute distress.  Resting comfortably at home.  Head is normocephalic,  atraumatic.  No labored breathing.  Speech is clear and coherent with logical content.  Patient is alert and oriented at baseline.    Assessment and Plan: There are no diagnoses linked to this encounter. - Rest - Push fluids, electrolyte beverages - Work note provided  Follow Up Instructions: I discussed the assessment and treatment plan with the patient. The patient was provided an opportunity to ask questions and all were answered. The patient agreed with the plan and demonstrated an understanding of the instructions.  A copy of instructions were sent to the patient via MyChart unless otherwise noted below.     The patient was advised to call back or seek an in-person evaluation if the symptoms worsen or if the condition fails to improve as anticipated.   [name], PA-C

## 2023-03-16 ENCOUNTER — Ambulatory Visit: Payer: 59 | Admitting: Physician Assistant

## 2023-03-16 ENCOUNTER — Encounter: Payer: Self-pay | Admitting: Physician Assistant

## 2023-03-16 VITALS — BP 128/74 | HR 91 | Temp 98.1°F | Resp 16 | Ht 69.5 in | Wt 231.9 lb

## 2023-03-16 DIAGNOSIS — F4310 Post-traumatic stress disorder, unspecified: Secondary | ICD-10-CM

## 2023-03-16 NOTE — Progress Notes (Signed)
[facility] to address chronic mood/ mental health disorder.

## 2023-03-17 ENCOUNTER — Other Ambulatory Visit: Payer: Self-pay

## 2023-03-17 ENCOUNTER — Encounter: Payer: Self-pay | Admitting: Nurse Practitioner

## 2023-03-17 ENCOUNTER — Ambulatory Visit (INDEPENDENT_AMBULATORY_CARE_PROVIDER_SITE_OTHER): Payer: 59 | Admitting: Nurse Practitioner

## 2023-03-17 VITALS — BP 124/68 | HR 90 | Temp 98.2°F | Resp 16 | Ht 69.5 in | Wt 233.4 lb

## 2023-03-17 DIAGNOSIS — F419 Anxiety disorder, unspecified: Secondary | ICD-10-CM | POA: Diagnosis not present

## 2023-03-17 DIAGNOSIS — F331 Major depressive disorder, recurrent, moderate: Secondary | ICD-10-CM | POA: Insufficient documentation

## 2023-03-17 DIAGNOSIS — Z0289 Encounter for other administrative examinations: Secondary | ICD-10-CM | POA: Diagnosis not present

## 2023-03-17 DIAGNOSIS — F4310 Post-traumatic stress disorder, unspecified: Secondary | ICD-10-CM

## 2023-03-17 MED ORDER — SERTRALINE HCL 25 MG PO TABS: 25.0000 mg | ORAL_TABLET | Freq: Every day | ORAL | 0 refills | Status: DC

## 2023-03-17 NOTE — Assessment & Plan Note (Signed)
FMLA paperwork filled out, start zoloft 25 mg daily, follow up in 4 weeks

## 2023-03-17 NOTE — Progress Notes (Signed)
BP 124/68   Pulse 90   Temp 98.2 F (36.8 C) (Oral)   Resp 16   Ht 5' 9.5" (1.765 m)   Wt 233 lb 6.4 oz (105.9 kg)   SpO2 98%   BMI 33.97 kg/m    Subjective:    Patient ID: Nicholas Haney, male    DOB: [birth year], 42 y.o.   MRN: [id number]  HPI: Nicholas Haney is a 42 y.o. male  Chief Complaint  Patient presents with   Stress    Paperwork filled out   Depression/anxiety/PTSD: patient was being seen by the [place] but reports he is no longer being treated there, he says he was last seen 4 years ago.  He reports his stress has increased.  He would like FMLA paperwork filled out. Previously on wellbutrin.  He reports he did not feel like the medication helped at all.  Will start zoloft and follow up in 4 weeks. FMLA paperwork filled out from 03/08/2023 -04/10/2023.  Medication : none currently Compliant n/a Side effects n/a PHQ9 positive GAD positive Therapy not currently     03/17/2023   12:54 PM 03/16/2023    1:04 PM 01/19/2023    8:47 AM 07/07/2021   10:30 AM 05/06/2021    9:16 AM  Depression screen PHQ 2/9  Decreased Interest 2 2 2  0 1  Down, Depressed, Hopeless 2 2 2  0 0  PHQ - 2 Score 4 4 4  0 1  Altered sleeping 1 2 0  0  Tired, decreased energy 1 2 1   0  Change in appetite 1 2 0  0  Feeling bad or failure about yourself  2 1 1   0  Trouble concentrating 2 2 1   0  Moving slowly or fidgety/restless 0 0 1  0  Suicidal thoughts 1 0 0  0  PHQ-9 Score 12 13 8  1   Difficult doing work/chores Very difficult Very difficult Somewhat difficult  Not difficult at all       03/17/2023   12:56 PM 03/16/2023    1:04 PM 01/19/2023    8:48 AM  GAD 7 : Generalized Anxiety Score  Nervous, Anxious, on Edge 1 1 1   Control/stop worrying 1 1 1   Worry too much - different things 2 1 1   Trouble relaxing 2 1 1   Restless 1 1 1   Easily annoyed or irritable 2 1 1   Afraid - awful might happen 1 1 1   Total GAD 7 Score 10 7 7   Anxiety Difficulty Somewhat difficult Very difficult  Somewhat difficult     Relevant past medical, surgical, family and social history reviewed and updated as indicated. Interim medical history since our last visit reviewed. Allergies and medications reviewed and updated.  Review of Systems  Constitutional: Negative for fever or weight change.  Respiratory: Negative for cough and shortness of breath.   Cardiovascular: Negative for chest pain or palpitations.  Gastrointestinal: Negative for abdominal pain, no bowel changes.  Musculoskeletal: Negative for gait problem or joint swelling.  Skin: Negative for rash.  Neurological: Negative for dizziness or headache.  No other specific complaints in a complete review of systems (except as listed in HPI above).      Objective:    BP 124/68   Pulse 90   Temp 98.2 F (36.8 C) (Oral)   Resp 16   Ht 5' 9.5" (1.765 m)   Wt 233 lb 6.4 oz (105.9 kg)   SpO2 98%   BMI 33.97 kg/m  Wt Readings from Last 3 Encounters:  03/17/23 233 lb 6.4 oz (105.9 kg)  03/16/23 231 lb 14.4 oz (105.2 kg)  01/19/23 226 lb 4.8 oz (102.6 kg)    Physical Exam  Constitutional: Patient appears well-developed and well-nourished. Obese  No distress.  HEENT: head atraumatic, normocephalic, pupils equal and reactive to light, neck supple Cardiovascular: Normal rate, regular rhythm and normal heart sounds.  No murmur heard. No BLE edema. Pulmonary/Chest: Effort normal and breath sounds normal. No respiratory distress. Abdominal: Soft.  There is no tenderness. Psychiatric: Patient has a normal mood and affect. behavior is normal. Judgment and thought content normal.  Results for orders placed or performed in visit on 01/19/23  CBC with Differential/Platelet  Result Value Ref Range   WBC 5.2 3.8 - 10.8 Thousand/uL   RBC 4.80 4.20 - 5.80 Million/uL   Hemoglobin 14.6 13.2 - 17.1 g/dL   HCT 16.1 09.6 - 04.5 %   MCV 89.6 80.0 - 100.0 fL   MCH 30.4 27.0 - 33.0 pg   MCHC 34.0 32.0 - 36.0 g/dL   RDW 40.9 81.1 - 91.4 %    Platelets 234 140 - 400 Thousand/uL   MPV 11.1 7.5 - 12.5 fL   Neutro Abs 2,834 1,500 - 7,800 cells/uL   Lymphs Abs 1,836 850 - 3,900 cells/uL   Absolute Monocytes 458 200 - 950 cells/uL   Eosinophils Absolute 52 15 - 500 cells/uL   Basophils Absolute 21 0 - 200 cells/uL   Neutrophils Relative % 54.5 %   Total Lymphocyte 35.3 %   Monocytes Relative 8.8 %   Eosinophils Relative 1.0 %   Basophils Relative 0.4 %  COMPLETE METABOLIC PANEL WITH GFR  Result Value Ref Range   Glucose, Bld 85 65 - 99 mg/dL   BUN 15 7 - 25 mg/dL   Creat 7.82 9.56 - 2.13 mg/dL   eGFR 086 > OR = 60 VH/QIO/9.62X5   BUN/Creatinine Ratio SEE NOTE: 6 - 22 (calc)   Sodium 140 135 - 146 mmol/L   Potassium 4.4 3.5 - 5.3 mmol/L   Chloride 105 98 - 110 mmol/L   CO2 27 20 - 32 mmol/L   Calcium 10.1 8.6 - 10.3 mg/dL   Total Protein 7.6 6.1 - 8.1 g/dL   Albumin 4.6 3.6 - 5.1 g/dL   Globulin 3.0 1.9 - 3.7 g/dL (calc)   AG Ratio 1.5 1.0 - 2.5 (calc)   Total Bilirubin 1.1 0.2 - 1.2 mg/dL   Alkaline phosphatase (APISO) 62 36 - 130 U/L   AST 19 10 - 40 U/L   ALT 25 9 - 46 U/L  Lipid panel  Result Value Ref Range   Cholesterol 156 <200 mg/dL   HDL 54 > OR = 40 mg/dL   Triglycerides 70 <284 mg/dL   LDL Cholesterol (Calc) 86 mg/dL (calc)   Total CHOL/HDL Ratio 2.9 <5.0 (calc)   Non-HDL Cholesterol (Calc) 102 <130 mg/dL (calc)  Hemoglobin X3K  Result Value Ref Range   Hgb A1c MFr Bld 5.5 <5.7 % of total Hgb   Mean Plasma Glucose 111 mg/dL   eAG (mmol/L) 6.2 mmol/L  Hepatitis C antibody  Result Value Ref Range   Hepatitis C Ab NON-REACTIVE NON-REACTIVE      Assessment & Plan:   Problem List Items Addressed This Visit       Other   Anxiety    FMLA paperwork filled out, start zoloft 25 mg daily, follow up in 4 weeks  Relevant Medications   sertraline (ZOLOFT) 25 MG tablet   PTSD (post-traumatic stress disorder) - Primary    FMLA paperwork filled out, start zoloft 25 mg daily, follow up in 4 weeks       Relevant Medications   sertraline (ZOLOFT) 25 MG tablet   Moderate episode of recurrent major depressive disorder (HCC)    FMLA paperwork filled out, start zoloft 25 mg daily, follow up in 4 weeks      Relevant Medications   sertraline (ZOLOFT) 25 MG tablet   Other Visit Diagnoses     Encounter for completion of form with patient       FMLA paper work filled out        Follow up plan: Return in about 4 weeks (around 04/14/2023) for follow up.

## 2023-04-09 ENCOUNTER — Other Ambulatory Visit: Payer: Self-pay

## 2023-04-09 ENCOUNTER — Ambulatory Visit: Payer: 59 | Admitting: Nurse Practitioner

## 2023-04-09 ENCOUNTER — Encounter: Payer: Self-pay | Admitting: Nurse Practitioner

## 2023-04-09 VITALS — BP 120/76 | HR 93 | Temp 98.4°F | Resp 16 | Ht 69.5 in | Wt 235.1 lb

## 2023-04-09 DIAGNOSIS — Z0289 Encounter for other administrative examinations: Secondary | ICD-10-CM

## 2023-04-09 DIAGNOSIS — F419 Anxiety disorder, unspecified: Secondary | ICD-10-CM | POA: Diagnosis not present

## 2023-04-09 DIAGNOSIS — F4310 Post-traumatic stress disorder, unspecified: Secondary | ICD-10-CM | POA: Diagnosis not present

## 2023-04-09 DIAGNOSIS — F331 Major depressive disorder, recurrent, moderate: Secondary | ICD-10-CM | POA: Diagnosis not present

## 2023-04-09 MED ORDER — SERTRALINE HCL 50 MG PO TABS: 50.0000 mg | ORAL_TABLET | Freq: Every day | ORAL | 0 refills | Status: DC

## 2023-04-09 NOTE — Assessment & Plan Note (Addendum)
Increase zoloft to 50 mg daily follow up in 4 weeks. If no improvement may consider mood stabilizer

## 2023-04-09 NOTE — Progress Notes (Signed)
BP 120/76   Pulse 93   Temp 98.4 F (36.9 C) (Oral)   Resp 16   Ht 5' 9.5" (1.765 m)   Wt 235 lb 1.6 oz (106.6 kg)   SpO2 96%   BMI 34.22 kg/m    Subjective:    Patient ID: Nicholas Haney, male    DOB: February 05, 1981, 42 y.o.   MRN: [id number]  HPI: Nicholas Haney is a 42 y.o. male  Chief Complaint  Patient presents with   Medical Management of Chronic Issues    4 week recheck and paperwork    Depression/anxiety/PTSD: last OV patient reported he was being seen by the [place] but reports he is no longer being treated there, he says he was last seen 4 years ago.  He reported his stress had increased.  He wanted FMLA paperwork filled out. Previously on wellbutrin.  He reported he did not feel like the medication helped at all. started zoloft and he is here for 4 week follow up. Paperwork filled out  for patient to return to work. He reports he still feels on edge. Does not feel too much of a difference with the medication.  Will increase to 50 mg daily,  follow up in 4 weeks, discussed possibly adding on mood stabilizer.  Medication zoloft 25 mg daily Compliant yes Side effects no PHQ9 positive GAD positive Therapy not currently     04/09/2023    9:06 AM 03/17/2023   12:54 PM 03/16/2023    1:04 PM 01/19/2023    8:47 AM 07/07/2021   10:30 AM  Depression screen PHQ 2/9  Decreased Interest 1 2 2 2  0  Down, Depressed, Hopeless 1 2 2 2  0  PHQ - 2 Score 2 4 4 4  0  Altered sleeping 1 1 2  0   Tired, decreased energy 2 1 2 1    Change in appetite 1 1 2  0   Feeling bad or failure about yourself  2 2 1 1    Trouble concentrating 1 2 2 1    Moving slowly or fidgety/restless 0 0 0 1   Suicidal thoughts 1 1 0 0   PHQ-9 Score 10 12 13 8    Difficult doing work/chores Somewhat difficult Very difficult Very difficult Somewhat difficult        04/09/2023    9:10 AM 03/17/2023   12:56 PM 03/16/2023    1:04 PM 01/19/2023    8:48 AM  GAD 7 : Generalized Anxiety Score  Nervous, Anxious, on  Edge 1 1 1 1   Control/stop worrying 2 1 1 1   Worry too much - different things 1 2 1 1   Trouble relaxing 1 2 1 1   Restless 1 1 1 1   Easily annoyed or irritable 1 2 1 1   Afraid - awful might happen 1 1 1 1   Total GAD 7 Score 8 10 7 7   Anxiety Difficulty Somewhat difficult Somewhat difficult Very difficult Somewhat difficult     Relevant past medical, surgical, family and social history reviewed and updated as indicated. Interim medical history since our last visit reviewed. Allergies and medications reviewed and updated.  Review of Systems  Constitutional: Negative for fever or weight change.  Respiratory: Negative for cough and shortness of breath.   Cardiovascular: Negative for chest pain or palpitations.  Gastrointestinal: Negative for abdominal pain, no bowel changes.  Musculoskeletal: Negative for gait problem or joint swelling.  Skin: Negative for rash.  Neurological: Negative for dizziness or headache.  No other specific  complaints in a complete review of systems (except as listed in HPI above).      Objective:    BP 120/76   Pulse 93   Temp 98.4 F (36.9 C) (Oral)   Resp 16   Ht 5' 9.5" (1.765 m)   Wt 235 lb 1.6 oz (106.6 kg)   SpO2 96%   BMI 34.22 kg/m   Wt Readings from Last 3 Encounters:  04/09/23 235 lb 1.6 oz (106.6 kg)  03/17/23 233 lb 6.4 oz (105.9 kg)  03/16/23 231 lb 14.4 oz (105.2 kg)    Physical Exam  Constitutional: Patient appears well-developed and well-nourished. Obese  No distress.  HEENT: head atraumatic, normocephalic, pupils equal and reactive to light, neck supple Cardiovascular: Normal rate, regular rhythm and normal heart sounds.  No murmur heard. No BLE edema. Pulmonary/Chest: Effort normal and breath sounds normal. No respiratory distress. Abdominal: Soft.  There is no tenderness. Psychiatric: Patient has a normal mood and affect. behavior is normal. Judgment and thought content normal.  Results for orders placed or performed in visit  on 01/19/23  CBC with Differential/Platelet  Result Value Ref Range   WBC 5.2 3.8 - 10.8 Thousand/uL   RBC 4.80 4.20 - 5.80 Million/uL   Hemoglobin 14.6 13.2 - 17.1 g/dL   HCT 40.9 81.1 - 91.4 %   MCV 89.6 80.0 - 100.0 fL   MCH 30.4 27.0 - 33.0 pg   MCHC 34.0 32.0 - 36.0 g/dL   RDW 78.2 95.6 - 21.3 %   Platelets 234 140 - 400 Thousand/uL   MPV 11.1 7.5 - 12.5 fL   Neutro Abs 2,834 1,500 - 7,800 cells/uL   Lymphs Abs 1,836 850 - 3,900 cells/uL   Absolute Monocytes 458 200 - 950 cells/uL   Eosinophils Absolute 52 15 - 500 cells/uL   Basophils Absolute 21 0 - 200 cells/uL   Neutrophils Relative % 54.5 %   Total Lymphocyte 35.3 %   Monocytes Relative 8.8 %   Eosinophils Relative 1.0 %   Basophils Relative 0.4 %  COMPLETE METABOLIC PANEL WITH GFR  Result Value Ref Range   Glucose, Bld 85 65 - 99 mg/dL   BUN 15 7 - 25 mg/dL   Creat 0.86 5.78 - 4.69 mg/dL   eGFR 629 > OR = 60 BM/WUX/3.24M0   BUN/Creatinine Ratio SEE NOTE: 6 - 22 (calc)   Sodium 140 135 - 146 mmol/L   Potassium 4.4 3.5 - 5.3 mmol/L   Chloride 105 98 - 110 mmol/L   CO2 27 20 - 32 mmol/L   Calcium 10.1 8.6 - 10.3 mg/dL   Total Protein 7.6 6.1 - 8.1 g/dL   Albumin 4.6 3.6 - 5.1 g/dL   Globulin 3.0 1.9 - 3.7 g/dL (calc)   AG Ratio 1.5 1.0 - 2.5 (calc)   Total Bilirubin 1.1 0.2 - 1.2 mg/dL   Alkaline phosphatase (APISO) 62 36 - 130 U/L   AST 19 10 - 40 U/L   ALT 25 9 - 46 U/L  Lipid panel  Result Value Ref Range   Cholesterol 156 <200 mg/dL   HDL 54 > OR = 40 mg/dL   Triglycerides 70 <102 mg/dL   LDL Cholesterol (Calc) 86 mg/dL (calc)   Total CHOL/HDL Ratio 2.9 <5.0 (calc)   Non-HDL Cholesterol (Calc) 102 <130 mg/dL (calc)  Hemoglobin V2Z  Result Value Ref Range   Hgb A1c MFr Bld 5.5 <5.7 % of total Hgb   Mean Plasma Glucose 111 mg/dL  eAG (mmol/L) 6.2 mmol/L  Hepatitis C antibody  Result Value Ref Range   Hepatitis C Ab NON-REACTIVE NON-REACTIVE      Assessment & Plan:   Problem List Items  Addressed This Visit       Other   Anxiety - Primary    Increase zoloft to 50 mg daily follow up in 4 weeks. If no improvement may consider mood stabilizer       Relevant Medications   sertraline (ZOLOFT) 50 MG tablet   PTSD (post-traumatic stress disorder)    Increase zoloft to 50 mg daily follow up in 4 weeks. If no improvement may consider mood stabilizer       Relevant Medications   sertraline (ZOLOFT) 50 MG tablet   Moderate episode of recurrent major depressive disorder (HCC)    Increase zoloft to 50 mg daily follow up in 4 weeks. If no improvement may consider mood stabilizer       Relevant Medications   sertraline (ZOLOFT) 50 MG tablet   Other Visit Diagnoses     Encounter for completion of form with patient       form filled out to return to work         Follow up plan: Return in about 4 weeks (around 05/07/2023) for follow up.

## 2023-04-23 ENCOUNTER — Encounter: Payer: Self-pay | Admitting: Internal Medicine

## 2023-04-23 ENCOUNTER — Ambulatory Visit: Payer: 59 | Admitting: Internal Medicine

## 2023-04-23 ENCOUNTER — Encounter (HOSPITAL_COMMUNITY): Payer: Self-pay | Admitting: *Deleted

## 2023-04-23 VITALS — BP 118/80 | HR 71 | Temp 97.3°F | Ht 69.5 in | Wt 235.0 lb

## 2023-04-23 DIAGNOSIS — G4733 Obstructive sleep apnea (adult) (pediatric): Secondary | ICD-10-CM | POA: Diagnosis not present

## 2023-04-23 NOTE — Patient Instructions (Signed)
Recommend repeating Home Sleep test to re-assess underlying sleep apnea  Avoid secondhand smoke Avoid SICK contacts Recommend  Masking  when appropriate Recommend Keep up-to-date with vaccinations

## 2023-04-23 NOTE — Progress Notes (Signed)
@Patient  ID: Nicholas Haney, male    DOB: [birth year], 42 y.o.   MRN: [id number]    Previous [facility] pulmonary encounter: 42 year old male, former smoke quit in 2020. PMH significant for snoring, PTSD, anxiety/depression, covid-19, morbid obesity. 06/27/2021 Patient presents today for sleep consult. He has symptoms of loud snoring, witnessed apnea and daytime sleepiness. He has never had sleep study. Typical bedtime is between 10-11pm. It takes him 10-15 mins to fall asleep. He wakes up on average 1-2 times at night. He starts his day at 5am. Weight is up. Epworth 12. Denies narcolepsy or cataplexy.   Sleep questionnaire: Prior sleep study- None Symptoms- loud snoring, witnessed apnea, daytime sleepiness Bedtime- 10-11pm Time to fall asleep-10-15 mins Nocturnal awakenings-1-2 times Start of day- 5am Weight changes- gained weight  Epworth- 12   07/30/2021 07/23/21 that showed severe OSA (AHI 43/hr) with SpO2 low 68%.   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 Current weight is 235 pounds I have recommended that we repeat a home sleep study since he has not lost a significant amount of weight    No exacerbation at this time No evidence of heart failure at this time No evidence or signs of infection at this time No respiratory distress No fevers, chills, nausea, vomiting, diarrhea No evidence of lower extremity edema No evidence hemoptysis     No Known Allergies  Immunization History  Administered Date(s) Administered   Anthrax 07/04/2007,  08/03/2007, 03/23/2008   Hepatitis A, Adult 07/02/2001, 03/11/2002   Hepatitis B, ADULT 07/01/2002, 10/23/2002, 09/05/2003, 09/24/2003   IPV 01/06/2001   Influenza-Unspecified 07/01/2002, 05/12/2007, 03/01/2008, 05/02/2008   MMR 01/18/2001   Meningococcal Acwy, Unspecified 01/06/2001   Meningococcal Conjugate 01/06/2001   Novel Infuenza-h1n1-09 05/02/2008   Polio, Unspecified 01/06/2001   Smallpox 07/04/2007   Td 01/06/2001   Tetanus 09/13/2003   Typhoid Inactivated 10/23/2002, 07/22/2006   Varicella 10/23/2002    Past Medical History:  Diagnosis Date   Anxiety    Chronic knee pain    Depression    Herniated disc     Tobacco History: Social History   Tobacco Use  Smoking Status Former   Current packs/day: 0.00   Types: Cigarettes   Quit date: 10/31/2018   Years since quitting: 4.4  Smokeless Tobacco Never   Counseling given: Not Answered  BP 118/80 (BP Location: Right Arm, Cuff Size: Large)   Pulse 71   Temp (!) 97.3 F (36.3 C)   Ht 5' 9.5" (1.765 m)   Wt 235 lb (106.6 kg)   SpO2 98%   BMI 34.21 kg/m      Review of Systems: Gen:  Denies  fever, sweats, chills weight loss  HEENT: Denies blurred vision, double vision, ear pain, eye pain, hearing loss, nose bleeds, sore throat Cardiac:  No dizziness, chest pain or heaviness, chest tightness,edema, No JVD Resp:   No cough, -sputum production, -shortness of breath,-wheezing, -hemoptysis,  Other:  All other systems negative   Physical Examination:   General Appearance: No distress  EYES PERRLA, EOM intact.  NECK Supple, No JVD Pulmonary: normal breath sounds, No wheezing.  CardiovascularNormal S1,S2.  No m/r/g.   Abdomen: Benign, Soft, non-tender. Neurology UE/LE 5/5 strength, no focal deficits Ext pulses intact, cap refill intact ALL OTHER ROS ARE NEGATIVE   CBC    Component Value Date/Time   WBC 5.2 01/19/2023 0913   RBC 4.80 01/19/2023 0913   HGB 14.6 01/19/2023 0913   HGB 15.8 06/27/2014  0457   HCT 43.0 01/19/2023 0913   HCT 47.0 06/27/2014 0457   PLT 234 01/19/2023 0913   PLT 205 06/27/2014 0457   MCV 89.6 01/19/2023 0913   MCV 95 06/27/2014 0457   MCH 30.4 01/19/2023 0913   MCHC 34.0 01/19/2023 0913   RDW 11.9 01/19/2023 0913   RDW 12.7 06/27/2014 0457   LYMPHSABS 1,836 01/19/2023 0913   LYMPHSABS 3.0 06/27/2014 0457   MONOABS 1.0 09/10/2021 0404   MONOABS 0.8 06/27/2014 0457   EOSABS 52 01/19/2023 0913   EOSABS 0.2 06/27/2014 0457   BASOSABS 21 01/19/2023 0913   BASOSABS 0.0 06/27/2014 0457    BMET    Component Value Date/Time   NA 140 01/19/2023 0913   NA 137 06/27/2014 0457   K 4.4 01/19/2023 0913   K 4.1 06/27/2014 0457   CL 105 01/19/2023 0913   CL 106 06/27/2014 0457   CO2 27 01/19/2023 0913   CO2 29 06/27/2014 0457   GLUCOSE 85 01/19/2023 0913   GLUCOSE 87 06/27/2014 0457   BUN 15 01/19/2023 0913   BUN 8 06/27/2014 0457   CREATININE 0.76 01/19/2023 0913   CALCIUM 10.1 01/19/2023 0913   CALCIUM 8.3 (L) 06/27/2014 0457   GFRNONAA >60 09/09/2021 1041   GFRNONAA >60 06/27/2014 0457   GFRNONAA >60 02/01/2013 1052   GFRAA >60 06/27/2014 0457   GFRAA >60 02/01/2013 1052       Assessment & Plan:   42 yo male diagnosis of Severe OSA, here today  follow up assessment today with extensive review of CPAP Download, patient has tried to use CPAP however did not tolerate well   Severe sleep apnea AHI of 43 at the weight of 295 pounds Patient underwent gastric bypass and lost about 60 pounds Patient cannot afford dental device Recommend repeat sleep study to assess for underlying interval change  Obesity - Patient underwent gastric bypass in April 2023. Once he hits his goal weight we can discuss repeating sleep study to re-evaluate sleep apnea    CURRENT MEDICATIONS REVIEWED AT LENGTH WITH PATIENT TODAY   Patient  satisfied with Plan of action and management. All questions answered  Follow up  6 months  Total Time Spent  35  mins   Wallis Bamberg Santiago Glad, M.D.  [facility]  Medical Director [facility] Medical Director [facility] Cardio-Pulmonary Department

## 2023-05-11 ENCOUNTER — Ambulatory Visit: Payer: 59 | Admitting: Nurse Practitioner

## 2023-05-11 VITALS — BP 122/70 | HR 74 | Temp 97.9°F | Resp 16 | Ht 70.0 in | Wt 230.0 lb

## 2023-05-11 DIAGNOSIS — F331 Major depressive disorder, recurrent, moderate: Secondary | ICD-10-CM | POA: Diagnosis not present

## 2023-05-11 DIAGNOSIS — F419 Anxiety disorder, unspecified: Secondary | ICD-10-CM | POA: Diagnosis not present

## 2023-05-11 DIAGNOSIS — F4310 Post-traumatic stress disorder, unspecified: Secondary | ICD-10-CM

## 2023-05-11 DIAGNOSIS — Z0289 Encounter for other administrative examinations: Secondary | ICD-10-CM | POA: Insufficient documentation

## 2023-05-11 MED ORDER — CARIPRAZINE HCL 1.5 MG PO CAPS: 1.5000 mg | ORAL_CAPSULE | Freq: Every day | ORAL | 0 refills | Status: DC

## 2023-05-11 MED ORDER — SERTRALINE HCL 50 MG PO TABS: 50.0000 mg | ORAL_TABLET | Freq: Every day | ORAL | 0 refills | Status: DC
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
## 2023-06-07 NOTE — Progress Notes (Signed)
 BP 120/70 (Cuff Size: Large)   Temp 98.1 F (36.7 C) (Oral)   Resp 16   Ht 5' 10 (1.778 m)   Wt 236 lb 12.8 oz (107.4 kg)   SpO2 97%   BMI 33.98 kg/m    Subjective:    Patient ID: Nicholas Haney, male    DOB: Dec 21, 1980, 42 y.o.   MRN: [id number]  HPI: Nicholas Haney is a 42 y.o. male  Chief Complaint  Patient presents with   Follow-up    4 week recheck    Discussed the use of AI scribe software for clinical note transcription with the patient, who gave verbal consent to proceed.  History of Present Illness   The patient, with a history of depression, anxiety, and PTSD, presents for a four-week follow-up. He reports no significant improvement in his mental health symptoms despite the addition of Vraylar  1.5mg  daily to his regimen of Sertraline  50mg  daily. He describes persistent tension, which has slightly improved, and ongoing anxiety, particularly in high-stress situations at work and at home. He often isolates himself when feeling overwhelmed, which has strained his relationship with his spouse. He expresses a desire to be more emotionally open and compassionate, but struggles to express his emotions, even in response to significant events such as the recent death of his grandmother. This emotional numbness has previously led to alcohol use as a coping mechanism, but he is currently abstinent and seeking healthier ways to manage his feelings.  In addition to his mental health concerns, the patient also reports chronic lower tailbone pain and headaches. The tailbone pain is a longstanding issue, possibly related to a previous surgery for a cyst in the area. The headaches are also a chronic issue, with no known triggers or relieving factors. The patient has been self-managing these headaches with over-the-counter Tylenol , sometimes taking up to four 500mg  tablets at a time.       06/08/2023    7:41 AM 05/11/2023    7:46 AM 04/09/2023    9:06 AM  Depression screen PHQ  2/9  Decreased Interest 2 2 1   Down, Depressed, Hopeless 2 2 1   PHQ - 2 Score 4 4 2   Altered sleeping 0 1 1  Tired, decreased energy 0 1 2  Change in appetite 0 1 1  Feeling bad or failure about yourself  2 2 2   Trouble concentrating 0 1 1  Moving slowly or fidgety/restless 0 1 0  Suicidal thoughts 2 2 1   PHQ-9 Score 8 13 10   Difficult doing work/chores  Very difficult Somewhat difficult       06/08/2023    7:42 AM 05/11/2023    7:48 AM 04/09/2023    9:10 AM 03/17/2023   12:56 PM  GAD 7 : Generalized Anxiety Score  Nervous, Anxious, on Edge 0 2 1 1   Control/stop worrying 1 1 2 1   Worry too much - different things 1 1 1 2   Trouble relaxing 1 1 1 2   Restless 1 1 1 1   Easily annoyed or irritable 1 1 1 2   Afraid - awful might happen 2 2 1 1   Total GAD 7 Score 7 9 8 10   Anxiety Difficulty Somewhat difficult Somewhat difficult Somewhat difficult Somewhat difficult     Relevant past medical, surgical, family and social history reviewed and updated as indicated. Interim medical history since our last visit reviewed. Allergies and medications reviewed and updated.  Review of Systems  Constitutional: Negative for fever or weight change.  Respiratory: Negative for cough and shortness of breath.   Cardiovascular: Negative for chest pain or palpitations.  Gastrointestinal: Negative for abdominal pain, no bowel changes.  Musculoskeletal: Negative for gait problem or joint swelling.  Skin: Negative for rash.  Neurological: Negative for dizziness or headache.  No other specific complaints in a complete review of systems (except as listed in HPI above).      Objective:    BP 120/70 (Cuff Size: Large)   Temp 98.1 F (36.7 C) (Oral)   Resp 16   Ht 5' 10 (1.778 m)   Wt 236 lb 12.8 oz (107.4 kg)   SpO2 97%   BMI 33.98 kg/m    Wt Readings from Last 3 Encounters:  06/08/23 236 lb 12.8 oz (107.4 kg)  05/11/23 230 lb (104.3 kg)  04/23/23 235 lb (106.6 kg)    Physical  Exam  Constitutional: Patient appears well-developed and well-nourished. Obese  No distress.  HEENT: head atraumatic, normocephalic, pupils equal and reactive to light, neck supple Cardiovascular: Normal rate, regular rhythm and normal heart sounds.  No murmur heard. No BLE edema. Pulmonary/Chest: Effort normal and breath sounds normal. No respiratory distress. Abdominal: Soft.  There is no tenderness. Psychiatric: Patient has a normal mood and affect. behavior is normal. Judgment and thought content normal.  Results for orders placed or performed in visit on 01/19/23  CBC with Differential/Platelet   Collection Time: 01/19/23  9:13 AM  Result Value Ref Range   WBC 5.2 3.8 - 10.8 Thousand/uL   RBC 4.80 4.20 - 5.80 Million/uL   Hemoglobin 14.6 13.2 - 17.1 g/dL   HCT 56.9 61.4 - 49.9 %   MCV 89.6 80.0 - 100.0 fL   MCH 30.4 27.0 - 33.0 pg   MCHC 34.0 32.0 - 36.0 g/dL   RDW 88.0 88.9 - 84.9 %   Platelets 234 140 - 400 Thousand/uL   MPV 11.1 7.5 - 12.5 fL   Neutro Abs 2,834 1,500 - 7,800 cells/uL   Lymphs Abs 1,836 850 - 3,900 cells/uL   Absolute Monocytes 458 200 - 950 cells/uL   Eosinophils Absolute 52 15 - 500 cells/uL   Basophils Absolute 21 0 - 200 cells/uL   Neutrophils Relative % 54.5 %   Total Lymphocyte 35.3 %   Monocytes Relative 8.8 %   Eosinophils Relative 1.0 %   Basophils Relative 0.4 %  COMPLETE METABOLIC PANEL WITH GFR   Collection Time: 01/19/23  9:13 AM  Result Value Ref Range   Glucose, Bld 85 65 - 99 mg/dL   BUN 15 7 - 25 mg/dL   Creat 9.23 9.39 - 8.70 mg/dL   eGFR 884 > OR = 60 fO/fpw/8.26f7   BUN/Creatinine Ratio SEE NOTE: 6 - 22 (calc)   Sodium 140 135 - 146 mmol/L   Potassium 4.4 3.5 - 5.3 mmol/L   Chloride 105 98 - 110 mmol/L   CO2 27 20 - 32 mmol/L   Calcium 10.1 8.6 - 10.3 mg/dL   Total Protein 7.6 6.1 - 8.1 g/dL   Albumin 4.6 3.6 - 5.1 g/dL   Globulin 3.0 1.9 - 3.7 g/dL (calc)   AG Ratio 1.5 1.0 - 2.5 (calc)   Total Bilirubin 1.1 0.2 - 1.2 mg/dL    Alkaline phosphatase (APISO) 62 36 - 130 U/L   AST 19 10 - 40 U/L   ALT 25 9 - 46 U/L  Lipid panel   Collection Time: 01/19/23  9:13 AM  Result Value Ref Range   Cholesterol  156 <200 mg/dL   HDL 54 > OR = 40 mg/dL   Triglycerides 70 <849 mg/dL   LDL Cholesterol (Calc) 86 mg/dL (calc)   Total CHOL/HDL Ratio 2.9 <5.0 (calc)   Non-HDL Cholesterol (Calc) 102 <130 mg/dL (calc)  Hemoglobin J8r   Collection Time: 01/19/23  9:13 AM  Result Value Ref Range   Hgb A1c MFr Bld 5.5 <5.7 % of total Hgb   Mean Plasma Glucose 111 mg/dL   eAG (mmol/L) 6.2 mmol/L  Hepatitis C antibody   Collection Time: 01/19/23  9:13 AM  Result Value Ref Range   Hepatitis C Ab NON-REACTIVE NON-REACTIVE       Assessment & Plan:   Problem List Items Addressed This Visit       Other   Anxiety - Primary   Relevant Medications   cariprazine  (VRAYLAR ) 1.5 MG capsule   sertraline  (ZOLOFT ) 100 MG tablet   PTSD (post-traumatic stress disorder)   Relevant Medications   cariprazine  (VRAYLAR ) 1.5 MG capsule   sertraline  (ZOLOFT ) 100 MG tablet   Moderate episode of recurrent major depressive disorder (HCC)   Relevant Medications   cariprazine  (VRAYLAR ) 1.5 MG capsule   sertraline  (ZOLOFT ) 100 MG tablet   Other Visit Diagnoses       Chronic tension-type headache, not intractable       Relevant Medications   sertraline  (ZOLOFT ) 100 MG tablet   naproxen  (NAPROSYN ) 500 MG tablet     Tail bone pain       Relevant Medications   naproxen  (NAPROSYN ) 500 MG tablet        Assessment and Plan    Depression, Anxiety, and PTSD Reports no significant improvement with addition of Vraylar  1.5mg  daily. Noted some reduction in tension. Persistent anxiety and difficulty expressing emotions. Discussed the impact on personal relationships and coping mechanisms. -Increase Sertraline  dose. -Continue Vraylar  1.5mg  daily. -Encouraged to pursue counseling, provided information previously.  Chronic Lower Tailbone Pain and  Chronic Headaches Longstanding issues, uses Tylenol  for relief. Pain sometimes associated with headaches. -Prescribe Naproxen  for pain as needed.  Follow-up in 4 weeks to assess response to medication changes.        Follow up plan: Return in about 4 weeks (around 07/06/2023) for follow up.

## 2023-06-08 ENCOUNTER — Encounter: Payer: Self-pay | Admitting: Nurse Practitioner

## 2023-06-08 ENCOUNTER — Ambulatory Visit: Payer: 59 | Admitting: Nurse Practitioner

## 2023-06-08 VITALS — BP 120/70 | Temp 98.1°F | Resp 16 | Ht 70.0 in | Wt 236.8 lb

## 2023-06-08 DIAGNOSIS — G44229 Chronic tension-type headache, not intractable: Secondary | ICD-10-CM

## 2023-06-08 DIAGNOSIS — F419 Anxiety disorder, unspecified: Secondary | ICD-10-CM

## 2023-06-08 DIAGNOSIS — F4310 Post-traumatic stress disorder, unspecified: Secondary | ICD-10-CM

## 2023-06-08 DIAGNOSIS — M533 Sacrococcygeal disorders, not elsewhere classified: Secondary | ICD-10-CM

## 2023-06-08 DIAGNOSIS — F331 Major depressive disorder, recurrent, moderate: Secondary | ICD-10-CM

## 2023-06-08 MED ORDER — CARIPRAZINE HCL 1.5 MG PO CAPS: 1.5000 mg | ORAL_CAPSULE | Freq: Every day | ORAL | 0 refills | Status: DC

## 2023-06-08 MED ORDER — SERTRALINE HCL 100 MG PO TABS: 100.0000 mg | ORAL_TABLET | Freq: Every day | ORAL | 0 refills | Status: DC

## 2023-06-08 MED ORDER — NAPROXEN 500 MG PO TABS: 500.0000 mg | ORAL_TABLET | Freq: Two times a day (BID) | ORAL | 3 refills | Status: DC | PRN

## 2023-06-22 ENCOUNTER — Ambulatory Visit: Payer: 59 | Admitting: Podiatry

## 2023-06-28 ENCOUNTER — Ambulatory Visit: Payer: 59 | Admitting: Podiatry

## 2023-06-28 ENCOUNTER — Encounter: Payer: Self-pay | Admitting: Podiatry

## 2023-06-28 VITALS — Ht 70.0 in | Wt 236.8 lb

## 2023-06-28 DIAGNOSIS — L600 Ingrowing nail: Secondary | ICD-10-CM

## 2023-06-28 MED ORDER — NEOMYCIN-POLYMYXIN-HC 3.5-10000-1 OT SUSP: OTIC | 0 refills | Status: DC

## 2023-06-28 NOTE — Patient Instructions (Signed)

## 2023-06-28 NOTE — Progress Notes (Signed)
  Subjective:  Patient ID: Nicholas Haney, male    DOB: Jul 11, 1980,  MRN: [id number]  Chief Complaint  Patient presents with   Ingrown Toenail    Pt is here due to ingrown on his right great toenail, states the ingrown has been there for a while and hurts really bad, wants some relief.    42 y.o. male presents with the above complaint. History confirmed with patient.   Objective:  Physical Exam: warm, good capillary refill, no trophic changes or ulcerative lesions, normal DP and PT pulses, normal sensory exam, and ingrown right great medial border no paronychia  Assessment:   1. Ingrowing right great toenail      Plan:  Patient was evaluated and treated and all questions answered.    Ingrown Nail, right -Patient elects to proceed with minor surgery to remove ingrown toenail today. Consent reviewed and signed by patient. -Ingrown nail excised. See procedure note. -Educated on post-procedure care including soaking. Written instructions provided and reviewed. -Rx for Cortisporin sent to pharmacy. -Advised on signs and symptoms of infection developing.  We discussed that the phenol likely will create some redness and edema and tenderness around the nailbed as long as it is localized this is to be expected.  Will return as needed if any infection signs develop  Procedure: Excision of Ingrown Toenail Location: Right 1st toe medial nail borders. Anesthesia: Lidocaine 1% plain; 1.5 mL and Marcaine 0.5% plain; 1.5 mL, digital block. Skin Prep: Betadine. Dressing: Silvadene; telfa; dry, sterile, compression dressing. Technique: Following skin prep, the toe was exsanguinated and a tourniquet was secured at the base of the toe. The affected nail border was freed, split with a nail splitter, and excised. Chemical matrixectomy was then performed with phenol and irrigated out with alcohol. The tourniquet was then removed and sterile dressing applied. Disposition: Patient tolerated  procedure well.    No follow-ups on file.

## 2023-07-05 NOTE — Progress Notes (Unsigned)
There were no vitals taken for this visit.   Subjective:    Patient ID: Nicholas Haney, male    DOB: 19-Oct-1980, 43 y.o.   MRN: [id number]  HPI: Nicholas Haney is a 43 y.o. male  No chief complaint on file.   Discussed the use of AI scribe software for clinical note transcription with the patient, who gave verbal consent to proceed.  History of Present Illness           06/08/2023    7:41 AM 05/11/2023    7:46 AM 04/09/2023    9:06 AM  Depression screen PHQ 2/9  Decreased Interest 2 2 1   Down, Depressed, Hopeless 2 2 1   PHQ - 2 Score 4 4 2   Altered sleeping 0 1 1  Tired, decreased energy 0 1 2  Change in appetite 0 1 1  Feeling bad or failure about yourself  2 2 2   Trouble concentrating 0 1 1  Moving slowly or fidgety/restless 0 1 0  Suicidal thoughts 2 2 1   PHQ-9 Score 8 13 10   Difficult doing work/chores  Very difficult Somewhat difficult    Relevant past medical, surgical, family and social history reviewed and updated as indicated. Interim medical history since our last visit reviewed. Allergies and medications reviewed and updated.  Review of Systems  Per HPI unless specifically indicated above     Objective:    There were no vitals taken for this visit.  {Vitals History (Optional):23777} Wt Readings from Last 3 Encounters:  06/28/23 236 lb 12.8 oz (107.4 kg)  06/08/23 236 lb 12.8 oz (107.4 kg)  05/11/23 230 lb (104.3 kg)    Physical Exam  Results for orders placed or performed in visit on 01/19/23  CBC with Differential/Platelet   Collection Time: 01/19/23  9:13 AM  Result Value Ref Range   WBC 5.2 3.8 - 10.8 Thousand/uL   RBC 4.80 4.20 - 5.80 Million/uL   Hemoglobin 14.6 13.2 - 17.1 g/dL   HCT 78.2 95.6 - 21.3 %   MCV 89.6 80.0 - 100.0 fL   MCH 30.4 27.0 - 33.0 pg   MCHC 34.0 32.0 - 36.0 g/dL   RDW 08.6 57.8 - 46.9 %   Platelets 234 140 - 400 Thousand/uL   MPV 11.1 7.5 - 12.5 fL   Neutro Abs 2,834 1,500 - 7,800 cells/uL   Lymphs  Abs 1,836 850 - 3,900 cells/uL   Absolute Monocytes 458 200 - 950 cells/uL   Eosinophils Absolute 52 15 - 500 cells/uL   Basophils Absolute 21 0 - 200 cells/uL   Neutrophils Relative % 54.5 %   Total Lymphocyte 35.3 %   Monocytes Relative 8.8 %   Eosinophils Relative 1.0 %   Basophils Relative 0.4 %  COMPLETE METABOLIC PANEL WITH GFR   Collection Time: 01/19/23  9:13 AM  Result Value Ref Range   Glucose, Bld 85 65 - 99 mg/dL   BUN 15 7 - 25 mg/dL   Creat 6.29 5.28 - 4.13 mg/dL   eGFR 244 > OR = 60 WN/UUV/2.53G6   BUN/Creatinine Ratio SEE NOTE: 6 - 22 (calc)   Sodium 140 135 - 146 mmol/L   Potassium 4.4 3.5 - 5.3 mmol/L   Chloride 105 98 - 110 mmol/L   CO2 27 20 - 32 mmol/L   Calcium 10.1 8.6 - 10.3 mg/dL   Total Protein 7.6 6.1 - 8.1 g/dL   Albumin 4.6 3.6 - 5.1 g/dL   Globulin 3.0 1.9 -  3.7 g/dL (calc)   AG Ratio 1.5 1.0 - 2.5 (calc)   Total Bilirubin 1.1 0.2 - 1.2 mg/dL   Alkaline phosphatase (APISO) 62 36 - 130 U/L   AST 19 10 - 40 U/L   ALT 25 9 - 46 U/L  Lipid panel   Collection Time: 01/19/23  9:13 AM  Result Value Ref Range   Cholesterol 156 <200 mg/dL   HDL 54 > OR = 40 mg/dL   Triglycerides 70 <161 mg/dL   LDL Cholesterol (Calc) 86 mg/dL (calc)   Total CHOL/HDL Ratio 2.9 <5.0 (calc)   Non-HDL Cholesterol (Calc) 102 <130 mg/dL (calc)  Hemoglobin W9U   Collection Time: 01/19/23  9:13 AM  Result Value Ref Range   Hgb A1c MFr Bld 5.5 <5.7 % of total Hgb   Mean Plasma Glucose 111 mg/dL   eAG (mmol/L) 6.2 mmol/L  Hepatitis C antibody   Collection Time: 01/19/23  9:13 AM  Result Value Ref Range   Hepatitis C Ab NON-REACTIVE NON-REACTIVE   {Labs (Optional):23779}    Assessment & Plan:   Problem List Items Addressed This Visit   None    Assessment and Plan             Follow up plan: No follow-ups on file.

## 2023-07-06 ENCOUNTER — Ambulatory Visit: Payer: 59 | Admitting: Nurse Practitioner

## 2023-07-06 ENCOUNTER — Encounter: Payer: Self-pay | Admitting: Nurse Practitioner

## 2023-07-06 VITALS — BP 116/76 | HR 77 | Temp 98.2°F | Resp 16 | Ht 70.0 in | Wt 240.1 lb

## 2023-07-06 DIAGNOSIS — F4310 Post-traumatic stress disorder, unspecified: Secondary | ICD-10-CM

## 2023-07-06 DIAGNOSIS — F331 Major depressive disorder, recurrent, moderate: Secondary | ICD-10-CM | POA: Diagnosis not present

## 2023-07-06 DIAGNOSIS — F419 Anxiety disorder, unspecified: Secondary | ICD-10-CM | POA: Diagnosis not present

## 2023-07-06 MED ORDER — SERTRALINE HCL 50 MG PO TABS: 50.0000 mg | ORAL_TABLET | Freq: Every day | ORAL | 0 refills | Status: DC

## 2023-07-06 MED ORDER — SERTRALINE HCL 100 MG PO TABS: 100.0000 mg | ORAL_TABLET | Freq: Every day | ORAL | 0 refills | Status: DC

## 2023-07-06 MED ORDER — BUPROPION HCL ER (XL) 150 MG PO TB24: 150.0000 mg | ORAL_TABLET | Freq: Every day | ORAL | 0 refills | Status: DC

## 2023-08-02 NOTE — Progress Notes (Unsigned)
 There were no vitals taken for this visit.   Subjective:    Patient ID: Nicholas Haney, male    DOB: [birth year], 43 y.o.   MRN: [id number]  HPI: Nicholas Haney is a 43 y.o. male  No chief complaint on file.   Discussed the use of AI scribe software for clinical note transcription with the patient, who gave verbal consent to proceed.  History of Present Illness           07/06/2023    7:43 AM 06/08/2023    7:41 AM 05/11/2023    7:46 AM  Depression screen PHQ 2/9  Decreased Interest 2 2 2   Down, Depressed, Hopeless 2 2 2   PHQ - 2 Score 4 4 4   Altered sleeping 2 0 1  Tired, decreased energy 2 0 1  Change in appetite 2 0 1  Feeling bad or failure about yourself  2 2 2   Trouble concentrating 2 0 1  Moving slowly or fidgety/restless 0 0 1  Suicidal thoughts 2 2 2   PHQ-9 Score 16 8 13   Difficult doing work/chores Somewhat difficult  Very difficult    Relevant past medical, surgical, family and social history reviewed and updated as indicated. Interim medical history since our last visit reviewed. Allergies and medications reviewed and updated.  Review of Systems  Per HPI unless specifically indicated above     Objective:    There were no vitals taken for this visit.  {Vitals History (Optional):23777} Wt Readings from Last 3 Encounters:  07/06/23 240 lb 1.6 oz (108.9 kg)  06/28/23 236 lb 12.8 oz (107.4 kg)  06/08/23 236 lb 12.8 oz (107.4 kg)    Physical Exam  Results for orders placed or performed in visit on 01/19/23  CBC with Differential/Platelet   Collection Time: 01/19/23  9:13 AM  Result Value Ref Range   WBC 5.2 3.8 - 10.8 Thousand/uL   RBC 4.80 4.20 - 5.80 Million/uL   Hemoglobin 14.6 13.2 - 17.1 g/dL   HCT 40.9 81.1 - 91.4 %   MCV 89.6 80.0 - 100.0 fL   MCH 30.4 27.0 - 33.0 pg   MCHC 34.0 32.0 - 36.0 g/dL   RDW 78.2 95.6 - 21.3 %   Platelets 234 140 - 400 Thousand/uL   MPV 11.1 7.5 - 12.5 fL   Neutro Abs 2,834 1,500 - 7,800 cells/uL    Lymphs Abs 1,836 850 - 3,900 cells/uL   Absolute Monocytes 458 200 - 950 cells/uL   Eosinophils Absolute 52 15 - 500 cells/uL   Basophils Absolute 21 0 - 200 cells/uL   Neutrophils Relative % 54.5 %   Total Lymphocyte 35.3 %   Monocytes Relative 8.8 %   Eosinophils Relative 1.0 %   Basophils Relative 0.4 %  COMPLETE METABOLIC PANEL WITH GFR   Collection Time: 01/19/23  9:13 AM  Result Value Ref Range   Glucose, Bld 85 65 - 99 mg/dL   BUN 15 7 - 25 mg/dL   Creat 0.86 5.78 - 4.69 mg/dL   eGFR 629 > OR = 60 BM/WUX/3.24M0   BUN/Creatinine Ratio SEE NOTE: 6 - 22 (calc)   Sodium 140 135 - 146 mmol/L   Potassium 4.4 3.5 - 5.3 mmol/L   Chloride 105 98 - 110 mmol/L   CO2 27 20 - 32 mmol/L   Calcium 10.1 8.6 - 10.3 mg/dL   Total Protein 7.6 6.1 - 8.1 g/dL   Albumin 4.6 3.6 - 5.1 g/dL   Globulin 3.0  1.9 - 3.7 g/dL (calc)   AG Ratio 1.5 1.0 - 2.5 (calc)   Total Bilirubin 1.1 0.2 - 1.2 mg/dL   Alkaline phosphatase (APISO) 62 36 - 130 U/L   AST 19 10 - 40 U/L   ALT 25 9 - 46 U/L  Lipid panel   Collection Time: 01/19/23  9:13 AM  Result Value Ref Range   Cholesterol 156 <200 mg/dL   HDL 54 > OR = 40 mg/dL   Triglycerides 70 <401 mg/dL   LDL Cholesterol (Calc) 86 mg/dL (calc)   Total CHOL/HDL Ratio 2.9 <5.0 (calc)   Non-HDL Cholesterol (Calc) 102 <130 mg/dL (calc)  Hemoglobin U2V   Collection Time: 01/19/23  9:13 AM  Result Value Ref Range   Hgb A1c MFr Bld 5.5 <5.7 % of total Hgb   Mean Plasma Glucose 111 mg/dL   eAG (mmol/L) 6.2 mmol/L  Hepatitis C antibody   Collection Time: 01/19/23  9:13 AM  Result Value Ref Range   Hepatitis C Ab NON-REACTIVE NON-REACTIVE   {Labs (Optional):23779}    Assessment & Plan:   Problem List Items Addressed This Visit   None    Assessment and Plan             Follow up plan: No follow-ups on file.

## 2023-08-03 ENCOUNTER — Other Ambulatory Visit: Payer: Self-pay

## 2023-08-03 ENCOUNTER — Encounter: Payer: Self-pay | Admitting: Nurse Practitioner

## 2023-08-03 ENCOUNTER — Ambulatory Visit: Payer: 59 | Admitting: Nurse Practitioner

## 2023-08-03 VITALS — BP 120/70 | HR 82 | Temp 98.1°F | Resp 16 | Ht 70.0 in | Wt 244.6 lb

## 2023-08-03 DIAGNOSIS — E66812 Obesity, class 2: Secondary | ICD-10-CM | POA: Insufficient documentation

## 2023-08-03 DIAGNOSIS — F4310 Post-traumatic stress disorder, unspecified: Secondary | ICD-10-CM

## 2023-08-03 DIAGNOSIS — F331 Major depressive disorder, recurrent, moderate: Secondary | ICD-10-CM | POA: Diagnosis not present

## 2023-08-03 DIAGNOSIS — Z9884 Bariatric surgery status: Secondary | ICD-10-CM

## 2023-08-03 DIAGNOSIS — F419 Anxiety disorder, unspecified: Secondary | ICD-10-CM | POA: Diagnosis not present

## 2023-08-03 DIAGNOSIS — Z6835 Body mass index (BMI) 35.0-35.9, adult: Secondary | ICD-10-CM

## 2023-08-03 MED ORDER — SEMAGLUTIDE-WEIGHT MANAGEMENT 0.5 MG/0.5ML ~~LOC~~ SOAJ: 0.5000 mg | SUBCUTANEOUS | 0 refills | Status: AC

## 2023-08-03 MED ORDER — SEMAGLUTIDE-WEIGHT MANAGEMENT 1.7 MG/0.75ML ~~LOC~~ SOAJ: 1.7000 mg | SUBCUTANEOUS | 0 refills | Status: AC

## 2023-08-03 MED ORDER — SEMAGLUTIDE-WEIGHT MANAGEMENT 2.4 MG/0.75ML ~~LOC~~ SOAJ: 2.4000 mg | SUBCUTANEOUS | 0 refills | Status: AC

## 2023-08-03 MED ORDER — SEMAGLUTIDE-WEIGHT MANAGEMENT 1 MG/0.5ML ~~LOC~~ SOAJ: 1.0000 mg | SUBCUTANEOUS | 0 refills | Status: AC

## 2023-08-03 MED ORDER — SEMAGLUTIDE-WEIGHT MANAGEMENT 0.25 MG/0.5ML ~~LOC~~ SOAJ: 0.2500 mg | SUBCUTANEOUS | 0 refills | Status: AC

## 2023-09-10 ENCOUNTER — Telehealth: Admitting: Family Medicine

## 2023-09-10 DIAGNOSIS — J301 Allergic rhinitis due to pollen: Secondary | ICD-10-CM | POA: Diagnosis not present

## 2023-09-10 MED ORDER — PREDNISONE 20 MG PO TABS: 20.0000 mg | ORAL_TABLET | Freq: Two times a day (BID) | ORAL | 0 refills | Status: AC

## 2023-09-10 MED ORDER — FLUTICASONE PROPIONATE 50 MCG/ACT NA SUSP: 2.0000 | Freq: Every day | NASAL | 6 refills | Status: DC

## 2023-09-10 MED ORDER — FEXOFENADINE HCL 180 MG PO TABS: 180.0000 mg | ORAL_TABLET | Freq: Every day | ORAL | 0 refills | Status: DC

## 2023-09-10 NOTE — Patient Instructions (Signed)

## 2023-09-10 NOTE — Progress Notes (Signed)
 Virtual Visit Consent   [name], you are scheduled for a virtual visit with a [facility] provider today. Just as with appointments in the office, your consent must be obtained to participate. Your consent will be active for this visit and any virtual visit you may have with one of our providers in the next 365 days. If you have a MyChart account, a copy of this consent can be sent to you electronically.  As this is a virtual visit, video technology does not allow for your provider to perform a traditional examination. This may limit your provider's ability to fully assess your condition. If your provider identifies any concerns that need to be evaluated in person or the need to arrange testing (such as labs, EKG, etc.), we will make arrangements to do so. Although advances in technology are sophisticated, we cannot ensure that it will always work on either your end or our end. If the connection with a video visit is poor, the visit may have to be switched to a telephone visit. With either a video or telephone visit, we are not always able to ensure that we have a secure connection.  By engaging in this virtual visit, you consent to the provision of healthcare and authorize for your insurance to be billed (if applicable) for the services provided during this visit. Depending on your insurance coverage, you may receive a charge related to this service.  I need to obtain your verbal consent now. Are you willing to proceed with your visit today? Nicholas Haney has provided verbal consent on 09/10/2023 for a virtual visit (video or telephone). Georgana Curio, FNP  Date: 09/10/2023 9:06 AM   Virtual Visit via Video Note   I, Georgana Curio, connected with  Nicholas Haney  ([id number], [birth year]) on 09/10/23 at  9:00 AM EDT by a video-enabled telemedicine application and verified that I am speaking with the correct person using two identifiers.  Location: Patient: Virtual Visit Location  Patient: Home Provider: Virtual Visit Location Provider: Home Office   I discussed the limitations of evaluation and management by telemedicine and the availability of in person appointments. The patient expressed understanding and agreed to proceed.    History of Present Illness: Nicholas Haney is a 43 y.o. who identifies as a male who was assigned male at birth, and is being seen today for allergies with nasal congestion, sneezing and itching eyes. For a week. He is not taking anything. He needs a work note. 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   Loud snoring 06/27/2021   Chronic pain 05/06/2021   Cannabis dependence (HCC) 05/06/2021   Pilonidal cyst without abscess 05/06/2021   Fatty liver 05/06/2021   PTSD (post-traumatic stress disorder) 01/23/2019   History of tobacco use 01/23/2019   History of 2019 novel coronavirus disease (COVID-19) 01/23/2019   Depressive disorder    Anxiety    Alcohol use disorder in remission 12/07/2018    Allergies: No Known Allergies Medications:  Current Outpatient Medications:    fexofenadine (ALLEGRA ALLERGY) 180 MG tablet, Take 1 tablet (180 mg total) by mouth  daily., Disp: 30 tablet, Rfl: 0   fluticasone (FLONASE) 50 MCG/ACT nasal spray, Place 2 sprays into both nostrils daily., Disp: 16 g, Rfl: 6   predniSONE (DELTASONE) 20 MG tablet, Take 1 tablet (20 mg total) by mouth 2 (two) times daily with a meal for 5 days., Disp: 10 tablet, Rfl: 0   Semaglutide-Weight Management 0.5 MG/0.5ML SOAJ, Inject 0.5 mg into the skin once a week for 28 days., Disp: 2  mL, Rfl: 0   [START ON 09/30/2023] Semaglutide-Weight Management 1 MG/0.5ML SOAJ, Inject 1 mg into the skin once a week for 28 days., Disp: 2 mL, Rfl: 0   [START ON 10/29/2023] Semaglutide-Weight Management 1.7 MG/0.75ML SOAJ, Inject 1.7 mg into the skin once a week for 28 days., Disp: 3 mL, Rfl: 0   [START ON 11/27/2023] Semaglutide-Weight Management 2.4 MG/0.75ML SOAJ, Inject 2.4 mg into the skin once a week for 28 days., Disp: 3 mL, Rfl: 0  Observations/Objective: Patient is well-developed, well-nourished in no acute distress.  Resting comfortably  at home.  Head is normocephalic, atraumatic.  No labored breathing.  Speech is clear and coherent with logical content.  Patient is alert and oriented at baseline.    Assessment and Plan: 1. Allergic rhinitis due to pollen, unspecified seasonality (Primary)  Increase fluids, med use discussed, UC as needed.   Follow Up Instructions: I discussed the assessment and treatment plan with the patient. The patient was provided an opportunity to ask questions and all were answered. The patient agreed with the plan and demonstrated an understanding of the instructions.  A copy of instructions were sent to the patient via MyChart unless otherwise noted below.     The patient was advised to call back or seek an in-person evaluation if the symptoms worsen or if the condition fails to improve as anticipated.    Georgana Curio, FNP

## 2023-09-15 ENCOUNTER — Encounter: Payer: Self-pay | Admitting: Physician Assistant

## 2023-09-15 ENCOUNTER — Telehealth

## 2023-09-15 DIAGNOSIS — J208 Acute bronchitis due to other specified organisms: Secondary | ICD-10-CM

## 2023-09-15 DIAGNOSIS — B9689 Other specified bacterial agents as the cause of diseases classified elsewhere: Secondary | ICD-10-CM

## 2023-09-15 MED ORDER — ALBUTEROL SULFATE HFA 108 (90 BASE) MCG/ACT IN AERS
1.0000 | INHALATION_SPRAY | Freq: Four times a day (QID) | RESPIRATORY_TRACT | 0 refills | Status: DC | PRN
Start: 2023-09-15 — End: 2024-01-31

## 2023-09-15 MED ORDER — AZITHROMYCIN 250 MG PO TABS: ORAL_TABLET | ORAL | 0 refills | Status: AC

## 2023-09-15 MED ORDER — BENZONATATE 100 MG PO CAPS: 100.0000 mg | ORAL_CAPSULE | Freq: Three times a day (TID) | ORAL | 0 refills | Status: DC | PRN

## 2023-09-15 MED ORDER — PSEUDOEPH-BROMPHEN-DM 30-2-10 MG/5ML PO SYRP: 5.0000 mL | ORAL_SOLUTION | Freq: Three times a day (TID) | ORAL | 0 refills | Status: DC | PRN

## 2023-09-15 NOTE — Patient Instructions (Signed)
 Nicholas Haney, thank you for joining Nicholas Loveless, PA-C for today's virtual visit.  While this provider is not your primary care provider (PCP), if your PCP is located in our provider database this encounter information will be shared with them immediately following your visit.   A [place] MyChart account gives you access to today's visit and all your visits, tests, and labs performed at [facility] " click here if you don't have a [place] MyChart account or go to [email or web address]  Consent: (Patient) Nicholas Haney provided verbal consent for this virtual visit at the beginning of the encounter.  Current Medications:  Current Outpatient Medications:    albuterol (VENTOLIN HFA) 108 (90 Base) MCG/ACT inhaler, Inhale 1-2 puffs into the lungs every 6 (six) hours as needed., Disp: 8 g, Rfl: 0   benzonatate (TESSALON) 100 MG capsule, Take 1-2 capsules (100-200 mg total) by mouth 3 (three) times daily as needed., Disp: 30 capsule, Rfl: 0   brompheniramine-pseudoephedrine-DM 30-2-10 MG/5ML syrup, Take 5 mLs by mouth 3 (three) times daily as needed., Disp: 120 mL, Rfl: 0   fexofenadine (ALLEGRA ALLERGY) 180 MG tablet, Take 1 tablet (180 mg total) by mouth daily., Disp: 30 tablet, Rfl: 0   fluticasone (FLONASE) 50 MCG/ACT nasal spray, Place 2 sprays into both nostrils daily., Disp: 16 g, Rfl: 6   predniSONE (DELTASONE) 20 MG tablet, Take 1 tablet (20 mg total) by mouth 2 (two) times daily with a meal for 5 days., Disp: 10 tablet, Rfl: 0   Semaglutide-Weight Management 0.5 MG/0.5ML SOAJ, Inject 0.5 mg into the skin once a week for 28 days., Disp: 2 mL, Rfl: 0   [START ON 09/30/2023] Semaglutide-Weight Management 1 MG/0.5ML SOAJ, Inject 1 mg into the skin once a week for 28 days., Disp: 2 mL, Rfl: 0   [START ON 10/29/2023] Semaglutide-Weight Management 1.7 MG/0.75ML SOAJ, Inject 1.7 mg into the skin once a week for 28 days., Disp: 3 mL, Rfl: 0   [START ON  11/27/2023] Semaglutide-Weight Management 2.4 MG/0.75ML SOAJ, Inject 2.4 mg into the skin once a week for 28 days., Disp: 3 mL, Rfl: 0   Medications ordered in this encounter:  Meds ordered this encounter  Medications   brompheniramine-pseudoephedrine-DM 30-2-10 MG/5ML syrup    Sig: Take 5 mLs by mouth 3 (three) times daily as needed.    Dispense:  120 mL    Refill:  0    Supervising Provider:   Merrilee Jansky [[id number]]   benzonatate (TESSALON) 100 MG capsule    Sig: Take 1-2 capsules (100-200 mg total) by mouth 3 (three) times daily as needed.    Dispense:  30 capsule    Refill:  0    Supervising Provider:   Merrilee Jansky [[id number]]   albuterol (VENTOLIN HFA) 108 (90 Base) MCG/ACT inhaler    Sig: Inhale 1-2 puffs into the lungs every 6 (six) hours as needed.    Dispense:  8 g    Refill:  0    Supervising Provider:   Merrilee Jansky [[id number]]     *If you need refills on other medications prior to your next appointment, please contact your pharmacy*  Follow-Up: Call back or seek an in-person evaluation if the symptoms worsen or if the condition fails to improve as anticipated.  [facility] [phone]  Other Instructions Upper Respiratory Infection, Adult An upper respiratory infection (URI) is a common viral infection of the nose, throat, and upper air passages that  lead to the lungs. The most common type of URI is the common cold. URIs usually get better on their own, without medical treatment. What are the causes? A URI is caused by a virus. You may catch a virus by: Breathing in droplets from an infected person's cough or sneeze. Touching something that has been exposed to the virus (is contaminated) and then touching your mouth, nose, or eyes. What increases the risk? You are more likely to get a URI if: You are very young or very old. You have close contact with others, such as at work, school, or a health care facility. You smoke. You have  long-term (chronic) heart or lung disease. You have a weakened disease-fighting system (immune system). You have nasal allergies or asthma. You are experiencing a lot of stress. You have poor nutrition. What are the signs or symptoms? A URI usually involves some of the following symptoms: Runny or stuffy (congested) nose. Cough. Sneezing. Sore throat. Headache. Fatigue. Fever. Loss of appetite. Pain in your forehead, behind your eyes, and over your cheekbones (sinus pain). Muscle aches. Redness or irritation of the eyes. Pressure in the ears or face. How is this diagnosed? This condition may be diagnosed based on your medical history and symptoms, and a physical exam. Your health care provider may use a swab to take a mucus sample from your nose (nasal swab). This sample can be tested to determine what virus is causing the illness. How is this treated? URIs usually get better on their own within 7-10 days. Medicines cannot cure URIs, but your health care provider may recommend certain medicines to help relieve symptoms, such as: Over-the-counter cold medicines. Cough suppressants. Coughing is a type of defense against infection that helps to clear the respiratory system, so take these medicines only as recommended by your health care provider. Fever-reducing medicines. Follow these instructions at home: Activity Rest as needed. If you have a fever, stay home from work or school until your fever is gone or until your health care provider says your URI cannot spread to other people (is no longer contagious). Your health care provider may have you wear a face mask to prevent your infection from spreading. Relieving symptoms Gargle with a mixture of salt and water 3-4 times a day or as needed. To make salt water, completely dissolve -1 tsp (3-6 g) of salt in 1 cup (237 mL) of warm water. Use a cool-mist humidifier to add moisture to the air. This can help you breathe more easily. Eating  and drinking  Drink enough fluid to keep your urine pale yellow. Eat soups and other clear broths. General instructions  Take over-the-counter and prescription medicines only as told by your health care provider. These include cold medicines, fever reducers, and cough suppressants. Do not use any products that contain nicotine or tobacco. These products include cigarettes, chewing tobacco, and vaping devices, such as e-cigarettes. If you need help quitting, ask your health care provider. Stay away from secondhand smoke. Stay up to date on all immunizations, including the yearly (annual) flu vaccine. Keep all follow-up visits. This is important. How to prevent the spread of infection to others URIs can be contagious. To prevent the infection from spreading: Wash your hands with soap and water for at least 20 seconds. If soap and water are not available, use hand sanitizer. Avoid touching your mouth, face, eyes, or nose. Cough or sneeze into a tissue or your sleeve or elbow instead of into your hand or into  the air.  Contact a health care provider if: You are getting worse instead of better. You have a fever or chills. Your mucus is brown or red. You have yellow or brown discharge coming from your nose. You have pain in your face, especially when you bend forward. You have swollen neck glands. You have pain while swallowing. You have white areas in the back of your throat. Get help right away if: You have shortness of breath that gets worse. You have severe or persistent: Headache. Ear pain. Sinus pain. Chest pain. You have chronic lung disease along with any of the following: Making high-pitched whistling sounds when you breathe, most often when you breathe out (wheezing). Prolonged cough (more than 14 days). Coughing up blood. A change in your usual mucus. You have a stiff neck. You have changes in your: Vision. Hearing. Thinking. Mood. These symptoms may be an emergency.  Get help right away. Call 911. Do not wait to see if the symptoms will go away. Do not drive yourself to the hospital. Summary An upper respiratory infection (URI) is a common infection of the nose, throat, and upper air passages that lead to the lungs. A URI is caused by a virus. URIs usually get better on their own within 7-10 days. Medicines cannot cure URIs, but your health care provider may recommend certain medicines to help relieve symptoms. This information is not intended to replace advice given to you by your health care provider. Make sure you discuss any questions you have with your health care provider. Document Revised: 12/25/2020 Document Reviewed: 12/25/2020 Elsevier Patient Education  2024 [employer]   If you have been instructed to have an in-person evaluation today at a local Urgent Care facility, please use the link below. It will take you to a list of all of our available [facility], including address, phone number and hours of operation. Please do not delay care.  [facility]  If you or a family member do not have a primary care provider, use the link below to schedule a visit and establish care. When you choose a [facility] primary care physician or advanced practice provider, you gain a long-term partner in health. Find a Primary Care Provider  Learn more about [facility]'s in-office and virtual care options: [facility] - Get Care Now

## 2023-09-15 NOTE — Progress Notes (Signed)
 Virtual Visit Consent   Nicholas Haney, you are scheduled for a virtual visit with a [facility] provider today. Just as with appointments in the office, your consent must be obtained to participate. Your consent will be active for this visit and any virtual visit you may have with one of our providers in the next 365 days. If you have a MyChart account, a copy of this consent can be sent to you electronically.  As this is a virtual visit, video technology does not allow for your provider to perform a traditional examination. This may limit your provider's ability to fully assess your condition. If your provider identifies any concerns that need to be evaluated in person or the need to arrange testing (such as labs, EKG, etc.), we will make arrangements to do so. Although advances in technology are sophisticated, we cannot ensure that it will always work on either your end or our end. If the connection with a video visit is poor, the visit may have to be switched to a telephone visit. With either a video or telephone visit, we are not always able to ensure that we have a secure connection.  By engaging in this virtual visit, you consent to the provision of healthcare and authorize for your insurance to be billed (if applicable) for the services provided during this visit. Depending on your insurance coverage, you may receive a charge related to this service.  I need to obtain your verbal consent now. Are you willing to proceed with your visit today? Nicholas Haney has provided verbal consent on 09/15/2023 for a virtual visit (video or telephone). [name], PA-C  Date: 09/15/2023 9:10 AM   Virtual Visit via Video Note   I, [name], connected with  Nicholas Haney  ([id number], 1980-12-16) on 09/15/23 at  8:45 AM EDT by a video-enabled telemedicine application and verified that I am speaking with the correct person using two identifiers.  Location: Patient: Virtual  Visit Location Patient: Home Provider: Virtual Visit Location Provider: Home Office   I discussed the limitations of evaluation and management by telemedicine and the availability of in person appointments. The patient expressed understanding and agreed to proceed.    History of Present Illness: Nicholas Haney is a 43 y.o. who identifies as a male who was assigned male at birth, and is being seen today for URI symptoms.  HPI: URI  This is a new problem. The current episode started 1 to 4 weeks ago (Seen 09/10/23, virtually, and diagnosed with seasonal allergies, given Fexofenadine, Flonase; symptoms progressive over 2 weeks). The problem has been gradually worsening. There has been no fever. Associated symptoms include congestion, coughing, headaches, rhinorrhea (and post nasal drainage), sinus pain, sneezing and wheezing. Pertinent negatives include no diarrhea, ear pain, nausea, plugged ear sensation or sore throat. Associated symptoms comments: fatigue. He has tried antihistamine, decongestant and acetaminophen (flonase) for the symptoms. The treatment provided no relief.     Problems:  Patient Active Problem List   Diagnosis Date Noted   Class 2 severe obesity due to excess calories with serious comorbidity and body mass index (BMI) of 35.0 to 35.9 in adult [facility]) 08/03/2023   Encounter for other administrative examinations 05/11/2023   Moderate episode of recurrent major depressive disorder (HCC) 03/17/2023   Severe sleep apnea 09/23/2021   S/P laparoscopic sleeve gastrectomy 09/09/2021   Loud snoring 06/27/2021   Chronic pain 05/06/2021   Cannabis dependence (HCC) 05/06/2021   Pilonidal cyst without  abscess 05/06/2021   Fatty liver 05/06/2021   PTSD (post-traumatic stress disorder) 01/23/2019   History of tobacco use 01/23/2019   History of 2019 novel coronavirus disease (COVID-19) 01/23/2019   Depressive disorder    Anxiety    Alcohol use disorder in remission 12/07/2018     Allergies: No Known Allergies Medications:  Current Outpatient Medications:    albuterol (VENTOLIN HFA) 108 (90 Base) MCG/ACT inhaler, Inhale 1-2 puffs into the lungs every 6 (six) hours as needed., Disp: 8 g, Rfl: 0   azithromycin (ZITHROMAX) 250 MG tablet, Take 2 tablets on day 1, then 1 tablet daily on days 2 through 5, Disp: 6 tablet, Rfl: 0   benzonatate (TESSALON) 100 MG capsule, Take 1-2 capsules (100-200 mg total) by mouth 3 (three) times daily as needed., Disp: 30 capsule, Rfl: 0   brompheniramine-pseudoephedrine-DM 30-2-10 MG/5ML syrup, Take 5 mLs by mouth 3 (three) times daily as needed., Disp: 120 mL, Rfl: 0   fexofenadine (ALLEGRA ALLERGY) 180 MG tablet, Take 1 tablet (180 mg total) by mouth daily., Disp: 30 tablet, Rfl: 0   fluticasone (FLONASE) 50 MCG/ACT nasal spray, Place 2 sprays into both nostrils daily., Disp: 16 g, Rfl: 6   predniSONE (DELTASONE) 20 MG tablet, Take 1 tablet (20 mg total) by mouth 2 (two) times daily with a meal for 5 days., Disp: 10 tablet, Rfl: 0   Semaglutide-Weight Management 0.5 MG/0.5ML SOAJ, Inject 0.5 mg into the skin once a week for 28 days., Disp: 2 mL, Rfl: 0   [START ON 09/30/2023] Semaglutide-Weight Management 1 MG/0.5ML SOAJ, Inject 1 mg into the skin once a week for 28 days., Disp: 2 mL, Rfl: 0   [START ON 10/29/2023] Semaglutide-Weight Management 1.7 MG/0.75ML SOAJ, Inject 1.7 mg into the skin once a week for 28 days., Disp: 3 mL, Rfl: 0   [START ON 11/27/2023] Semaglutide-Weight Management 2.4 MG/0.75ML SOAJ, Inject 2.4 mg into the skin once a week for 28 days., Disp: 3 mL, Rfl: 0  Observations/Objective: Patient is well-developed, well-nourished in no acute distress.  Resting comfortably at home.  Head is normocephalic, atraumatic.  No labored breathing.  Speech is clear and coherent with logical content.  Patient is alert and oriented at baseline.    Assessment and Plan: 1. Acute bacterial bronchitis (Primary) -  brompheniramine-pseudoephedrine-DM 30-2-10 MG/5ML syrup; Take 5 mLs by mouth 3 (three) times daily as needed.  Dispense: 120 mL; Refill: 0 - benzonatate (TESSALON) 100 MG capsule; Take 1-2 capsules (100-200 mg total) by mouth 3 (three) times daily as needed.  Dispense: 30 capsule; Refill: 0 - albuterol (VENTOLIN HFA) 108 (90 Base) MCG/ACT inhaler; Inhale 1-2 puffs into the lungs every 6 (six) hours as needed.  Dispense: 8 g; Refill: 0 - azithromycin (ZITHROMAX) 250 MG tablet; Take 2 tablets on day 1, then 1 tablet daily on days 2 through 5  Dispense: 6 tablet; Refill: 0  - Worsening over a week despite OTC medications - Will treat with Z-pack, Albuterol, Bromfed DM and tessalon perles - Can continue Mucinex (PLAIN) - Push fluids.  - Rest.  - Steam and humidifier can help - Seek in person evaluation if worsening or symptoms fail to improve    Follow Up Instructions: I discussed the assessment and treatment plan with the patient. The patient was provided an opportunity to ask questions and all were answered. The patient agreed with the plan and demonstrated an understanding of the instructions.  A copy of instructions were sent to the patient via MyChart  unless otherwise noted below.    The patient was advised to call back or seek an in-person evaluation if the symptoms worsen or if the condition fails to improve as anticipated.    [name], PA-C

## 2023-12-01 ENCOUNTER — Ambulatory Visit: Payer: 59 | Admitting: Nurse Practitioner

## 2024-01-11 NOTE — Progress Notes (Signed)
 This encounter was created in error - please disregard.

## 2024-01-31 ENCOUNTER — Ambulatory Visit (INDEPENDENT_AMBULATORY_CARE_PROVIDER_SITE_OTHER): Admitting: Nurse Practitioner

## 2024-01-31 VITALS — BP 122/82 | HR 75 | Temp 98.0°F | Resp 18 | Ht 70.0 in | Wt 227.2 lb

## 2024-01-31 DIAGNOSIS — Z0000 Encounter for general adult medical examination without abnormal findings: Secondary | ICD-10-CM | POA: Diagnosis not present

## 2024-01-31 DIAGNOSIS — Z130 Encounter for screening for diseases of the blood and blood-forming organs and certain disorders involving the immune mechanism: Secondary | ICD-10-CM

## 2024-01-31 DIAGNOSIS — Z131 Encounter for screening for diabetes mellitus: Secondary | ICD-10-CM

## 2024-01-31 DIAGNOSIS — Z13220 Encounter for screening for lipoid disorders: Secondary | ICD-10-CM

## 2024-01-31 NOTE — Progress Notes (Addendum)
 Name: Nicholas Haney   MRN: [id number]    DOB: [birth year]   Date:01/31/2024       Progress Note  Subjective  Chief Complaint  Chief Complaint  Patient presents with   Annual Exam    HPI  Patient presents for annual CPE .  Discussed the use of AI scribe software for clinical note transcription with the patient, who gave verbal consent to proceed.  History of Present Illness Nicholas Haney is a 43 year old male who presents for an annual physical exam.  General health maintenance - Presents for annual physical examination - Weight decreased from 244 pounds to 227 pounds since last visit - Engages in regular cardiovascular exercise, including running and pushups - Maintains a well-balanced diet - Sleeps approximately five to six hours per night  Visual disturbance - Blurry vision attributed to aging - No other problems with eyesight - Suspects need for corrective lenses due to blurriness  Genitourinary and gastrointestinal symptoms - No issues with urination - No issues with bowel movements  Pain assessment - Denies any pain    Diet: well balanced diet Exercise: cardio Sleep: 5-6 hours Last dental exam:last year Last eye exam: needs to schedule  Depression: phq 9 is negative    01/31/2024    8:12 AM 08/03/2023    7:49 AM 07/06/2023    7:43 AM 06/08/2023    7:41 AM 05/11/2023    7:46 AM  Depression screen PHQ 2/9  Decreased Interest 0 3 2 2 2   Down, Depressed, Hopeless 0 1 2 2 2   PHQ - 2 Score 0 4 4 4 4   Altered sleeping 0 1 2 0 1  Tired, decreased energy 0 1 2 0 1  Change in appetite 0 1 2 0 1  Feeling bad or failure about yourself  0 2 2 2 2   Trouble concentrating 0 2 2 0 1  Moving slowly or fidgety/restless 0 1 0 0 1  Suicidal thoughts 0 1 2 2 2   PHQ-9 Score 0 13 16 8 13   Difficult doing work/chores Not difficult at all Somewhat difficult Somewhat difficult  Very difficult    Hypertension:  BP Readings from Last 3 Encounters:  01/31/24  122/82  08/03/23 120/70  07/06/23 116/76    Obesity: Wt Readings from Last 3 Encounters:  01/31/24 227 lb 3.2 oz (103.1 kg)  08/03/23 244 lb 9.6 oz (110.9 kg)  07/06/23 240 lb 1.6 oz (108.9 kg)   BMI Readings from Last 3 Encounters:  01/31/24 32.60 kg/m  08/03/23 35.10 kg/m  07/06/23 34.45 kg/m   Waist Measurement : (not recorded)   Lipids:  Lab Results  Component Value Date   CHOL 156 01/19/2023   Lab Results  Component Value Date   HDL 54 01/19/2023   Lab Results  Component Value Date   LDLCALC 86 01/19/2023   Lab Results  Component Value Date   TRIG 70 01/19/2023   Lab Results  Component Value Date   CHOLHDL 2.9 01/19/2023   No results found for: LDLDIRECT Glucose:  Glucose  Date Value Ref Range Status  06/27/2014 87 65 - 99 mg/dL Final  98/80/7983 880 (H) 65 - 99 mg/dL Final  91/72/7985 880 (H) 65 - 99 mg/dL Final   Glucose, Bld  Date Value Ref Range Status  01/19/2023 85 65 - 99 mg/dL Final    Comment:    .            Fasting reference interval .  08/27/2021 107 (H) 70 - 99 mg/dL Final    Comment:    Glucose reference range applies only to samples taken after fasting for at least 8 hours.    Flowsheet Row Office Visit from 01/31/2024 in [facility]  AUDIT-C Score 0     Married STD testing and prevention (HIV/chl/gon/syphilis): completed Hep C: completed  Skin cancer: Discussed monitoring for atypical lesions Colorectal cancer: does not qualify Prostate cancer: does not qualify No results found for: PSA   Lung cancer:   Low Dose CT Chest recommended if Age 74-80 years, 30 pack-year currently smoking OR have quit w/in 15years. Patient does not qualify.   AAA:  The USPSTF recommends one-time screening with ultrasonography in men ages 52 to 28 years who have ever smoked ECG:  06/04/2021  Vaccines:  HPV: up to at age 28 , ask insurance if age between 80-45  Shingrix: 69-64 yo and ask insurance if covered  when patient above 20 yo Pneumonia:  educated and discussed with patient. Flu:  educated and discussed with patient.  Advanced Care Planning: A voluntary discussion about advance care planning including the explanation and discussion of advance directives.  Discussed health care proxy and Living will, and the patient was able to identify a health care proxy as wife.  Patient does not have a living will at present time. If patient does have living will, I have requested they bring this to the clinic to be scanned in to their chart.  Patient Active Problem List   Diagnosis Date Noted   Class 2 severe obesity due to excess calories with serious comorbidity and body mass index (BMI) of 35.0 to 35.9 in adult [facility]) 08/03/2023   Encounter for other administrative examinations 05/11/2023   Moderate episode of recurrent major depressive disorder (HCC) 03/17/2023   Severe sleep apnea 09/23/2021   S/P laparoscopic sleeve gastrectomy 09/09/2021   Loud snoring 06/27/2021   Chronic pain 05/06/2021   Cannabis dependence (HCC) 05/06/2021   Pilonidal cyst without abscess 05/06/2021   Fatty liver 05/06/2021   PTSD (post-traumatic stress disorder) 01/23/2019   History of tobacco use 01/23/2019   History of 2019 novel coronavirus disease (COVID-19) 01/23/2019   Depressive disorder    Anxiety    Alcohol use disorder in remission 12/07/2018    Past Surgical History:  Procedure Laterality Date   BACK SURGERY     CHOLECYSTECTOMY     GALLBLADDER SURGERY     herniated disc     UPPER GI ENDOSCOPY N/A 09/09/2021   Procedure: UPPER GI ENDOSCOPY;  Surgeon: Gladis Cough, MD;  Location: [facility];  Service: General;  Laterality: N/A;    Family History  Problem Relation Age of Onset   Depression Mother     Social History   Socioeconomic History   Marital status: Married    Spouse name: chandra   Number of children: 2   Years of education: Not on file   Highest education level: 12th grade  Occupational  History   Not on file  Tobacco Use   Smoking status: Former    Current packs/day: 0.00    Types: Cigarettes    Quit date: 10/31/2018    Years since quitting: 5.2   Smokeless tobacco: Never  Vaping Use   Vaping status: Never Used  Substance and Sexual Activity   Alcohol use: Not Currently    Comment: stopped May 25   Drug use: No   Sexual activity: Yes  Other Topics Concern  Not on file  Social History Narrative   Not on file   Social Drivers of Health   Financial Resource Strain: Low Risk  (01/31/2024)   Overall Financial Resource Strain (CARDIA)    Difficulty of Paying Living Expenses: Not hard at all  Food Insecurity: No Food Insecurity (01/31/2024)   Hunger Vital Sign    Worried About Running Out of Food in the Last Year: Never true    Ran Out of Food in the Last Year: Never true  Transportation Needs: No Transportation Needs (01/31/2024)   PRAPARE - Administrator, Civil Service (Medical): No    Lack of Transportation (Non-Medical): No  Physical Activity: Sufficiently Active (01/31/2024)   Exercise Vital Sign    Days of Exercise per Week: 5 days    Minutes of Exercise per Session: 60 min  Stress: Stress Concern Present (01/31/2024)   [employer] of Occupational Health - Occupational Stress Questionnaire    Feeling of Stress: Very much  Social Connections: Socially Isolated (01/31/2024)   Social Connection and Isolation Panel    Frequency of Communication with Friends and Family: Never    Frequency of Social Gatherings with Friends and Family: Never    Attends Religious Services: Never    Database administrator or Organizations: No    Attends Engineer, structural: Not on file    Marital Status: Married  Catering manager Violence: Not At Risk (01/31/2024)   Humiliation, Afraid, Rape, and Kick questionnaire    Fear of Current or Ex-Partner: No    Emotionally Abused: No    Physically Abused: No    Sexually Abused: No    No current outpatient  medications on file.  No Known Allergies   ROS  Constitutional: Negative for fever or weight change.  Respiratory: Negative for cough and shortness of breath.   Cardiovascular: Negative for chest pain or palpitations.  Gastrointestinal: Negative for abdominal pain, no bowel changes.  Musculoskeletal: Negative for gait problem or joint swelling.  Skin: Negative for rash.  Neurological: Negative for dizziness or headache.  No other specific complaints in a complete review of systems (except as listed in HPI above).    Objective  Vitals:   01/31/24 0810  BP: 122/82  Pulse: 75  Resp: 18  Temp: 98 F (36.7 C)  SpO2: 98%  Weight: 227 lb 3.2 oz (103.1 kg)  Height: 5' 10 (1.778 m)    Body mass index is 32.6 kg/m.  Physical Exam Vitals reviewed.  Constitutional:      Appearance: Normal appearance.  HENT:     Head: Normocephalic.     Right Ear: Tympanic membrane normal.     Left Ear: Tympanic membrane normal.     Nose: Nose normal.  Eyes:     Extraocular Movements: Extraocular movements intact.     Conjunctiva/sclera: Conjunctivae normal.     Pupils: Pupils are equal, round, and reactive to light.  Neck:     Thyroid: No thyroid mass, thyromegaly or thyroid tenderness.  Cardiovascular:     Rate and Rhythm: Normal rate and regular rhythm.     Pulses: Normal pulses.     Heart sounds: Normal heart sounds.  Pulmonary:     Effort: Pulmonary effort is normal.     Breath sounds: Normal breath sounds.  Abdominal:     General: Bowel sounds are normal.     Palpations: Abdomen is soft.  Musculoskeletal:        General: Normal range of motion.  Cervical back: Normal range of motion and neck supple.     Right lower leg: No edema.     Left lower leg: No edema.  Skin:    General: Skin is warm and dry.     Capillary Refill: Capillary refill takes less than 2 seconds.  Neurological:     General: No focal deficit present.     Mental Status: He is alert and oriented to  person, place, and time. Mental status is at baseline.  Psychiatric:        Mood and Affect: Mood normal.        Behavior: Behavior normal.        Thought Content: Thought content normal.        Judgment: Judgment normal.      No results found for this or any previous visit (from the past 2160 hours).   Fall Risk:    01/31/2024    8:12 AM 08/03/2023    7:48 AM 07/06/2023    7:40 AM 05/11/2023    7:46 AM 04/09/2023    9:06 AM  Fall Risk   Falls in the past year? 0 0 0 0 0  Number falls in past yr: 0 0 0  0  Injury with Fall? 0 0 0  0  Risk for fall due to :  No Fall Risks No Fall Risks No Fall Risks No Fall Risks  Follow up Falls evaluation completed Falls evaluation completed Falls prevention discussed;Education provided;Falls evaluation completed Falls prevention discussed Falls prevention discussed      Functional Status Survey: Is the patient deaf or have difficulty hearing?: No Does the patient have difficulty seeing, even when wearing glasses/contacts?: No Does the patient have difficulty concentrating, remembering, or making decisions?: No Does the patient have difficulty walking or climbing stairs?: No Does the patient have difficulty dressing or bathing?: No Does the patient have difficulty doing errands alone such as visiting a doctor's office or shopping?: No    Assessment & Plan  Problem List Items Addressed This Visit   None Visit Diagnoses       Annual physical exam    -  Primary   Relevant Orders   CBC with Differential/Platelet   Comprehensive metabolic panel with GFR   Lipid panel   Hemoglobin A1c     Screening for deficiency anemia       Relevant Orders   CBC with Differential/Platelet     Screening for cholesterol level       Relevant Orders   Lipid panel     Screening for diabetes mellitus       Relevant Orders   Comprehensive metabolic panel with GFR   Hemoglobin A1c      Assessment and Plan Assessment & Plan Adult Wellness  Visit Routine adult wellness visit with no new health concerns. Blood pressure is 122/82 mmHg, within normal range. Weight decreased from 244 lbs to 227 lbs, indicating successful weight management. Cardiovascular and respiratory systems are functioning well. No issues with urination or bowel movements. Neurological examination is normal. - Schedule an eye exam due to reported blurry vision. - Discuss colorectal cancer screening options in two years, including colonoscopy and Cologuard. Colonoscopy requires prep and a day off work but offers a 10-year clearance if negative. Cologuard is done at home and offers a 3-year clearance if negative.     -Prostate cancer screening and PSA options (with potential risks and benefits of testing vs not testing) were discussed along with recent recs/guidelines. -  USPSTF grade A and B recommendations reviewed with patient; age-appropriate recommendations, preventive care, screening tests, etc discussed and encouraged; healthy living encouraged; see AVS for patient education given to patient -Discussed importance of 150 minutes of physical activity weekly, eat two servings of fish weekly, eat one serving of tree nuts ( cashews, pistachios, pecans, almonds.SABRA) every other day, eat 6 servings of fruit/vegetables daily and drink plenty of water and avoid sweet beverages.  -Reviewed Health Maintenance: yes

## 2024-02-01 ENCOUNTER — Ambulatory Visit: Payer: Self-pay | Admitting: Nurse Practitioner

## 2024-02-01 LAB — CBC WITH DIFFERENTIAL/PLATELET
Absolute Lymphocytes: 1517 {cells}/uL (ref 850–3900)
Absolute Monocytes: 379 {cells}/uL (ref 200–950)
Basophils Absolute: 19 {cells}/uL (ref 0–200)
Basophils Relative: 0.4 %
Eosinophils Absolute: 58 {cells}/uL (ref 15–500)
Eosinophils Relative: 1.2 %
HCT: 43.6 % (ref 38.5–50.0)
Hemoglobin: 14.6 g/dL (ref 13.2–17.1)
MCH: 30.9 pg (ref 27.0–33.0)
MCHC: 33.5 g/dL (ref 32.0–36.0)
MCV: 92.2 fL (ref 80.0–100.0)
MPV: 10.6 fL (ref 7.5–12.5)
Monocytes Relative: 7.9 %
Neutro Abs: 2827 {cells}/uL (ref 1500–7800)
Neutrophils Relative %: 58.9 %
Platelets: 242 Thousand/uL (ref 140–400)
RBC: 4.73 Million/uL (ref 4.20–5.80)
RDW: 12.4 % (ref 11.0–15.0)
Total Lymphocyte: 31.6 %
WBC: 4.8 Thousand/uL (ref 3.8–10.8)

## 2024-02-01 LAB — HEMOGLOBIN A1C
Hgb A1c MFr Bld: 5.3 % (ref <5.7)
Mean Plasma Glucose: 105 mg/dL
eAG (mmol/L): 5.8 mmol/L

## 2024-02-01 LAB — COMPREHENSIVE METABOLIC PANEL WITH GFR
AG Ratio: 1.4 (calc) (ref 1.0–2.5)
ALT: 20 U/L (ref 9–46)
AST: 16 U/L (ref 10–40)
Albumin: 4.7 g/dL (ref 3.6–5.1)
Alkaline phosphatase (APISO): 55 U/L (ref 36–130)
BUN: 16 mg/dL (ref 7–25)
CO2: 27 mmol/L (ref 20–32)
Calcium: 9.9 mg/dL (ref 8.6–10.3)
Chloride: 105 mmol/L (ref 98–110)
Creat: 0.77 mg/dL (ref 0.60–1.29)
Globulin: 3.3 g/dL (ref 1.9–3.7)
Glucose, Bld: 98 mg/dL (ref 65–99)
Potassium: 4.2 mmol/L (ref 3.5–5.3)
Sodium: 139 mmol/L (ref 135–146)
Total Bilirubin: 1 mg/dL (ref 0.2–1.2)
Total Protein: 8 g/dL (ref 6.1–8.1)
eGFR: 114 mL/min/1.73m2 (ref >=60)

## 2024-02-01 LAB — LIPID PANEL
Cholesterol: 158 mg/dL (ref <200)
HDL: 56 mg/dL (ref >=40)
LDL Cholesterol (Calc): 86 mg/dL
Non-HDL Cholesterol (Calc): 102 mg/dL (ref <130)
Total CHOL/HDL Ratio: 2.8 (calc) (ref <5.0)
Triglycerides: 70 mg/dL (ref <150)

## 2024-04-04 ENCOUNTER — Encounter (HOSPITAL_COMMUNITY): Payer: Self-pay | Admitting: *Deleted

## 2024-05-20 ENCOUNTER — Encounter: Payer: Self-pay | Admitting: Emergency Medicine

## 2024-05-20 ENCOUNTER — Emergency Department
Admission: EM | Admit: 2024-05-20 | Discharge: 2024-05-20 | Disposition: A | Discharge status: Home / Self Care | Attending: Emergency Medicine | Admitting: Emergency Medicine

## 2024-05-20 ENCOUNTER — Other Ambulatory Visit: Payer: Self-pay

## 2024-05-20 DIAGNOSIS — M5431 Sciatica, right side: Secondary | ICD-10-CM | POA: Insufficient documentation

## 2024-05-20 MED ORDER — OXYCODONE-ACETAMINOPHEN 5-325 MG PO TABS: 1.0000 | ORAL_TABLET | ORAL | 0 refills | Status: AC | PRN

## 2024-05-20 MED ORDER — PREDNISONE 10 MG PO TABS: 10.0000 mg | ORAL_TABLET | Freq: Every day | ORAL | 0 refills | Status: AC

## 2024-05-20 MED ORDER — PREDNISONE 20 MG PO TABS
60.0000 mg | ORAL_TABLET | Freq: Once | ORAL | Status: AC
  Administered 2024-05-20: 60 mg via ORAL
  Filled 2024-05-20: qty 3

## 2024-05-20 MED ORDER — MORPHINE SULFATE (PF) 4 MG/ML IV SOLN
4.0000 mg | Freq: Once | INTRAVENOUS | Status: AC
  Administered 2024-05-20: 4 mg via INTRAMUSCULAR
  Filled 2024-05-20: qty 1

## 2024-05-20 NOTE — Discharge Instructions (Signed)
 Please take your steroids as prescribed.  As we discussed you may take your pain medication as needed but only as written.  Do not drink alcohol or drive while taking pain medication.  You may also find some relief by using lidocaine  patches to the lower back available over-the-counter at any pharmacy.  Return to the emergency department for any weakness or numbness of your leg, any loss of control of your bladder or bowels or any other symptom personally concerning to yourself.  Please follow-up with your doctor otherwise on Monday as scheduled.

## 2024-05-20 NOTE — ED Provider Notes (Signed)
 [facility] Provider Note    Event Date/Time   First MD Initiated Contact with Patient 05/20/24 [id number]     (approximate)  History   Chief Complaint: Back Pain  HPI  Nicholas Haney is a 43 y.o. male with a past show anxiety, presents to the emergency department for low back pain.  According to the patient he has been experiencing an exacerbation of his known lower back issues.  For the last 3 days he has had shooting pain down the right leg.  Patient states the pain had worsened to the point where he was having trouble getting around he made an appointment with his doctor but could not be seen until Monday.  Patient did not think he could wait so he came to the emergency department.  Physical Exam   Triage Vital Signs: ED Triage Vitals  Encounter Vitals Group     BP 05/20/24 0204 128/69     Girls Systolic BP Percentile --      Girls Diastolic BP Percentile --      Boys Systolic BP Percentile --      Boys Diastolic BP Percentile --      Pulse Rate 05/20/24 0204 (!) 104     Resp 05/20/24 0204 (!) 21     Temp 05/20/24 0204 98.9 F (37.2 C)     Temp Source 05/20/24 0204 Oral     SpO2 05/20/24 0204 98 %     Weight 05/20/24 0205 225 lb (102.1 kg)     Height 05/20/24 0205 5' 10 (1.778 m)     Head Circumference --      Peak Flow --      Pain Score 05/20/24 0204 10     Pain Loc --      Pain Education --      Exclude from Growth Chart --     Most recent vital signs: Vitals:   05/20/24 0204  BP: 128/69  Pulse: (!) 104  Resp: (!) 21  Temp: 98.9 F (37.2 C)  SpO2: 98%    General: Awake, no distress.  CV:  Good peripheral perfusion.  Regular rate and rhythm  Resp:  Normal effort.  Equal breath sounds bilaterally.  Abd:  No distention.  Other:  No midline lumbar tenderness.  Patient states pain off to the right side.   ED Results / Procedures / Treatments   MEDICATIONS ORDERED IN ED: Medications  morphine  (PF) 4 MG/ML injection 4 mg (has  no administration in time range)  predniSONE  (DELTASONE ) tablet 60 mg (has no administration in time range)     IMPRESSION / MDM / ASSESSMENT AND PLAN / ED COURSE  I reviewed the triage vital signs and the nursing notes.  Patient's presentation is most consistent with exacerbation of chronic illness.  Patient presents to the emergency department for low back pain shooting down the right leg.  Patient states he has had similar pain in the past has had to have localized steroid injections in the past.  Patient denies any weakness or numbness of the leg, denies any incontinence.  We will dose IM morphine  as well as start the patient on prednisone .  Discussed with the patient high likelihood of sciatica.  Recommended that he still follow-up with his doctor on Monday as scheduled but we will start the patient on a prednisone  taper and prescribe a short course of pain medication for symptom relief.  Discussed my typical sciatica return precautions.  FINAL CLINICAL IMPRESSION(S) /  ED DIAGNOSES   Sciatica    Note:  This document was prepared using Dragon voice recognition software and may include unintentional dictation errors.   Dorothyann Drivers, MD 05/20/24 0300

## 2024-05-20 NOTE — ED Triage Notes (Signed)
 Pt to ED via POV, states hx of sciatic back pain, states pain has been ongoing x several days, has appt with PCP on Monday however is unable to make it until Monday due to pain.    Pt states hx of L3-L4 herniated disc. Pt denies any bowel/bladder incontinence.    Pt states pain is shooting/stabbing in nature, radiates from back to top of his knee.

## 2024-05-22 ENCOUNTER — Encounter: Payer: Self-pay | Admitting: Nurse Practitioner

## 2024-05-22 ENCOUNTER — Ambulatory Visit: Admitting: Nurse Practitioner

## 2024-05-22 VITALS — BP 168/80 | HR 86 | Temp 98.0°F | Ht 70.0 in | Wt 224.0 lb

## 2024-05-22 DIAGNOSIS — M5441 Lumbago with sciatica, right side: Secondary | ICD-10-CM | POA: Diagnosis not present

## 2024-05-22 MED ORDER — KETOROLAC TROMETHAMINE 60 MG/2ML IM SOLN
60.0000 mg | Freq: Once | INTRAMUSCULAR | Status: AC
  Administered 2024-05-22: 16:00:00 60 mg via INTRAMUSCULAR

## 2024-05-22 MED ORDER — METHOCARBAMOL 500 MG PO TABS: 500.0000 mg | ORAL_TABLET | Freq: Three times a day (TID) | ORAL | 0 refills | Status: AC

## 2024-05-22 MED ORDER — NAPROXEN 500 MG PO TABS: 500.0000 mg | ORAL_TABLET | Freq: Two times a day (BID) | ORAL | 0 refills | Status: AC

## 2024-05-22 NOTE — Progress Notes (Signed)
 BP (!) 168/80   Pulse 86   Temp 98 F (36.7 C)   Ht 5' 10 (1.778 m)   Wt 224 lb (101.6 kg)   SpO2 97%   BMI 32.14 kg/m    Subjective:    Patient ID: Nicholas Haney, male    DOB: [birth year], 43 y.o.   MRN: [id number]  HPI: Nicholas Haney is a 43 y.o. male  Chief Complaint  Patient presents with   Leg Pain    Pt c/o right leg pain x4 days. States pain starts at hip and radiates to knee. Numbness/ tingling in right foot.    Discussed the use of AI scribe software for clinical note transcription with the patient, who gave verbal consent to proceed.  History of Present Illness Nicholas Haney is a 43 year old male with sciatica who presents with right lower back pain radiating down his right leg.  Lumbosacral radicular pain - Severe right lower back pain radiating down the right leg for four days - Pain originates just above the hip and extends to the knee - Associated with numbness and tingling into the right foot - Pain severity greater than previous sciatica episodes - No specific incident or injury triggered the pain - Pain began after normal workout on Tuesday, with slight pain in the leg on Wednesday, worsening on Thursday, and becoming severe by Friday night  Functional impact - Pain decreased since Friday night, now able to walk without a limp - Persistent pain in the right leg - Concern about ability to return to work and pass a physical test for an [employer] in January  Prior interventions and medication side effects - Visited ER early Saturday morning due to severe pain - Received a shot and pills at the ER with minimal relief - Prescribed oxycodone , Tylenol , and a steroid taper - Taking three oxycodone  pills at a time due to insufficient pain relief from prescribed dose - Constipation attributed to medication use  Associated and exclusionary symptoms - No incontinence or urinary issues - Constipation present         01/31/2024    8:12 AM  08/03/2023    7:49 AM 07/06/2023    7:43 AM  Depression screen PHQ 2/9  Decreased Interest 0 3 2  Down, Depressed, Hopeless 0 1 2  PHQ - 2 Score 0 4 4  Altered sleeping 0 1 2  Tired, decreased energy 0 1 2  Change in appetite 0 1 2  Feeling bad or failure about yourself  0 2 2  Trouble concentrating 0 2 2  Moving slowly or fidgety/restless 0 1 0  Suicidal thoughts 0 1 2  PHQ-9 Score 0  13  16   Difficult doing work/chores Not difficult at all Somewhat difficult Somewhat difficult     Data saved with a previous flowsheet row definition    Relevant past medical, surgical, family and social history reviewed and updated as indicated. Interim medical history since our last visit reviewed. Allergies and medications reviewed and updated.  Review of Systems  Ten systems reviewed and is negative except as mentioned in HPI      Objective:      BP (!) 168/80   Pulse 86   Temp 98 F (36.7 C)   Ht 5' 10 (1.778 m)   Wt 224 lb (101.6 kg)   SpO2 97%   BMI 32.14 kg/m    Wt Readings from Last 3 Encounters:  05/22/24 224 lb (101.6 kg)  05/20/24 225 lb (102.1 kg)  01/31/24 227 lb 3.2 oz (103.1 kg)    Physical Exam VITALS: BP- 168/110 GENERAL: Alert, cooperative, well developed, no acute distress. HEENT: Normocephalic, normal oropharynx, moist mucous membranes. CHEST: Clear to auscultation bilaterally, no wheezes, rhonchi, or crackles. CARDIOVASCULAR: Normal heart rate and rhythm, S1 and S2 normal without murmurs. ABDOMEN: Soft, non-tender, non-distended, without organomegaly, normal bowel sounds. EXTREMITIES: No cyanosis or edema. MUSCULOSKELETAL: Tenderness at right sacroiliac joint. NEUROLOGICAL: Cranial nerves grossly intact, moves all extremities without gross motor or sensory deficit.  Results for orders placed or performed in visit on 01/31/24  CBC with Differential/Platelet   Collection Time: 01/31/24  8:28 AM  Result Value Ref Range   WBC 4.8 3.8 - 10.8 Thousand/uL    RBC 4.73 4.20 - 5.80 Million/uL   Hemoglobin 14.6 13.2 - 17.1 g/dL   HCT 56.3 61.4 - 49.9 %   MCV 92.2 80.0 - 100.0 fL   MCH 30.9 27.0 - 33.0 pg   MCHC 33.5 32.0 - 36.0 g/dL   RDW 87.5 88.9 - 84.9 %   Platelets 242 140 - 400 Thousand/uL   MPV 10.6 7.5 - 12.5 fL   Neutro Abs 2,827 1,500 - 7,800 cells/uL   Absolute Lymphocytes 1,517 850 - 3,900 cells/uL   Absolute Monocytes 379 200 - 950 cells/uL   Eosinophils Absolute 58 15 - 500 cells/uL   Basophils Absolute 19 0 - 200 cells/uL   Neutrophils Relative % 58.9 %   Total Lymphocyte 31.6 %   Monocytes Relative 7.9 %   Eosinophils Relative 1.2 %   Basophils Relative 0.4 %  Comprehensive metabolic panel with GFR   Collection Time: 01/31/24  8:28 AM  Result Value Ref Range   Glucose, Bld 98 65 - 99 mg/dL   BUN 16 7 - 25 mg/dL   Creat 9.22 9.39 - 8.70 mg/dL   eGFR 885 > OR = 60 fO/fpw/8.26f7   BUN/Creatinine Ratio SEE NOTE: 6 - 22 (calc)   Sodium 139 135 - 146 mmol/L   Potassium 4.2 3.5 - 5.3 mmol/L   Chloride 105 98 - 110 mmol/L   CO2 27 20 - 32 mmol/L   Calcium 9.9 8.6 - 10.3 mg/dL   Total Protein 8.0 6.1 - 8.1 g/dL   Albumin 4.7 3.6 - 5.1 g/dL   Globulin 3.3 1.9 - 3.7 g/dL (calc)   AG Ratio 1.4 1.0 - 2.5 (calc)   Total Bilirubin 1.0 0.2 - 1.2 mg/dL   Alkaline phosphatase (APISO) 55 36 - 130 U/L   AST 16 10 - 40 U/L   ALT 20 9 - 46 U/L  Lipid panel   Collection Time: 01/31/24  8:28 AM  Result Value Ref Range   Cholesterol 158 <200 mg/dL   HDL 56 > OR = 40 mg/dL   Triglycerides 70 <849 mg/dL   LDL Cholesterol (Calc) 86 mg/dL (calc)   Total CHOL/HDL Ratio 2.8 <5.0 (calc)   Non-HDL Cholesterol (Calc) 102 <130 mg/dL (calc)  Hemoglobin J8r   Collection Time: 01/31/24  8:28 AM  Result Value Ref Range   Hgb A1c MFr Bld 5.3 <5.7 %   Mean Plasma Glucose 105 mg/dL   eAG (mmol/L) 5.8 mmol/L          Assessment & Plan:   Problem List Items Addressed This Visit   None Visit Diagnoses       Acute right-sided low back  pain with right-sided sciatica    -  Primary   Relevant Medications  ketorolac  (TORADOL ) injection 60 mg (Completed)   naproxen  (NAPROSYN ) 500 MG tablet   methocarbamol  (ROBAXIN ) 500 MG tablet        Assessment and Plan Assessment & Plan Acute right-sided low back pain with sciatica Acute exacerbation of right-sided low back pain with sciatica, radiating from the right lower back to the knee with numbness and tingling into the right foot. Symptoms began four days ago, with significant pain on Friday night leading to an ER visit. Previous sciatica episode occurred ten years ago. Current pain is severe, affecting sleep and mobility, but has improved since the ER visit. No incontinence reported, but some constipation likely due to medication. Pain likely due to inflammation and nerve pressure. - Administered Toradol  injection for inflammation control. - Continue steroid taper as prescribed. - Prescribed muscle relaxer for muscle relaxation and rest. - Prescribed naproxen  to be taken twice daily starting at bedtime for at least ten days. - Advised continuation of heat therapy and use of TENS unit. - Instructed to report if symptoms do not improve.  Elevated blood pressure Blood pressure recorded at 168/110, likely elevated due to pain. No immediate concern for hypertension management during acute pain episode. - Rechecked blood pressure before leaving the clinic.        Follow up plan: Return if symptoms worsen or fail to improve.
